# Patient Record
Sex: Female | Born: 1985 | Race: Black or African American | Hispanic: No | Marital: Single | Smoking: Former smoker
Health system: Southern US, Community
[De-identification: ages and names within clinical notes are randomized; demographics above are authoritative.]

---

## 2007-02-28 ENCOUNTER — Emergency Department: Payer: Self-pay | Admitting: Emergency Medicine

## 2008-02-22 ENCOUNTER — Emergency Department: Payer: Self-pay | Admitting: Internal Medicine

## 2008-05-09 ENCOUNTER — Emergency Department: Payer: Self-pay | Admitting: Emergency Medicine

## 2008-11-25 ENCOUNTER — Emergency Department: Payer: Self-pay | Admitting: Emergency Medicine

## 2011-09-24 ENCOUNTER — Observation Stay: Payer: Self-pay

## 2011-09-24 LAB — URINALYSIS, COMPLETE
Bacteria: NONE SEEN
Bilirubin,UR: NEGATIVE
Blood: NEGATIVE
Ketone: NEGATIVE
Ph: 8 (ref 4.5–8.0)
Protein: NEGATIVE
RBC,UR: 1 /HPF (ref 0–5)
Specific Gravity: 1.017 (ref 1.003–1.030)
Squamous Epithelial: 8

## 2011-09-26 LAB — URINE CULTURE

## 2012-01-26 ENCOUNTER — Observation Stay: Payer: Self-pay | Admitting: Obstetrics and Gynecology

## 2012-01-31 ENCOUNTER — Inpatient Hospital Stay: Payer: Self-pay | Admitting: Obstetrics and Gynecology

## 2012-01-31 LAB — PIH PROFILE
Chloride: 107 mmol/L (ref 98–107)
Co2: 25 mmol/L (ref 21–32)
Creatinine: 0.97 mg/dL (ref 0.60–1.30)
EGFR (African American): >60
EGFR (Non-African Amer.): >60
HCT: 33.2 % — ABNORMAL LOW (ref 35.0–47.0)
HGB: 10.7 g/dL — ABNORMAL LOW (ref 12.0–16.0)
MCH: 26.4 pg (ref 26.0–34.0)
MCHC: 32.2 g/dL (ref 32.0–36.0)
MCV: 82 fL (ref 80–100)
Platelet: 162 10*3/uL (ref 150–440)
Potassium: 3.6 mmol/L (ref 3.5–5.1)
RBC: 4.06 10*6/uL (ref 3.80–5.20)
Uric Acid: 5.8 mg/dL (ref 2.6–6.0)

## 2012-01-31 LAB — PROTEIN / CREATININE RATIO, URINE: Creatinine, Urine: 36.2 mg/dL (ref 30.0–125.0)

## 2012-02-02 LAB — HEMATOCRIT: HCT: 28 % — ABNORMAL LOW (ref 35.0–47.0)

## 2012-12-01 ENCOUNTER — Emergency Department: Payer: Self-pay | Admitting: Emergency Medicine

## 2013-04-15 ENCOUNTER — Emergency Department: Payer: Self-pay | Admitting: Emergency Medicine

## 2013-04-15 LAB — COMPREHENSIVE METABOLIC PANEL
Albumin: 3.6 g/dL (ref 3.4–5.0)
Alkaline Phosphatase: 70 U/L (ref 50–136)
Anion Gap: 6 — ABNORMAL LOW (ref 7–16)
BUN: 11 mg/dL (ref 7–18)
Bilirubin,Total: 0.5 mg/dL (ref 0.2–1.0)
Chloride: 106 mmol/L (ref 98–107)
EGFR (African American): >60
EGFR (Non-African Amer.): >60
Osmolality: 277 (ref 275–301)
Potassium: 3.4 mmol/L — ABNORMAL LOW (ref 3.5–5.1)
SGOT(AST): 23 U/L (ref 15–37)
SGPT (ALT): 21 U/L (ref 12–78)
Sodium: 139 mmol/L (ref 136–145)
Total Protein: 7.4 g/dL (ref 6.4–8.2)

## 2013-04-15 LAB — URINALYSIS, COMPLETE
Bacteria: NONE SEEN
Bilirubin,UR: NEGATIVE
Protein: NEGATIVE
RBC,UR: 32 /HPF (ref 0–5)
Specific Gravity: 1.017 (ref 1.003–1.030)
Squamous Epithelial: 2
WBC UR: 11 /HPF (ref 0–5)

## 2013-04-15 LAB — CBC
HGB: 11.7 g/dL — ABNORMAL LOW (ref 12.0–16.0)
MCH: 27 pg (ref 26.0–34.0)
MCV: 81 fL (ref 80–100)
WBC: 7.8 10*3/uL (ref 3.6–11.0)

## 2013-04-15 LAB — WET PREP, GENITAL

## 2013-04-15 LAB — GC/CHLAMYDIA PROBE AMP

## 2013-07-31 ENCOUNTER — Emergency Department: Payer: Self-pay | Admitting: Emergency Medicine

## 2013-07-31 LAB — COMPREHENSIVE METABOLIC PANEL
ANION GAP: 6 — AB (ref 7–16)
Albumin: 4 g/dL (ref 3.4–5.0)
Alkaline Phosphatase: 69 U/L
BUN: 10 mg/dL (ref 7–18)
Bilirubin,Total: 0.6 mg/dL (ref 0.2–1.0)
CALCIUM: 9.5 mg/dL (ref 8.5–10.1)
CO2: 26 mmol/L (ref 21–32)
CREATININE: 0.88 mg/dL (ref 0.60–1.30)
Chloride: 101 mmol/L (ref 98–107)
EGFR (African American): >60
EGFR (Non-African Amer.): >60
Glucose: 75 mg/dL (ref 65–99)
Osmolality: 264 (ref 275–301)
Potassium: 3.5 mmol/L (ref 3.5–5.1)
SGOT(AST): 30 U/L (ref 15–37)
SGPT (ALT): 24 U/L (ref 12–78)
SODIUM: 133 mmol/L — AB (ref 136–145)
Total Protein: 8.6 g/dL — ABNORMAL HIGH (ref 6.4–8.2)

## 2013-07-31 LAB — CBC WITH DIFFERENTIAL/PLATELET
BASOS ABS: 0.1 10*3/uL (ref 0.0–0.1)
Basophil %: 0.8 %
EOS ABS: 0.1 10*3/uL (ref 0.0–0.7)
EOS PCT: 0.8 %
HCT: 41.6 % (ref 35.0–47.0)
HGB: 13.7 g/dL (ref 12.0–16.0)
Lymphocyte #: 2.8 10*3/uL (ref 1.0–3.6)
Lymphocyte %: 29.4 %
MCH: 25.9 pg — ABNORMAL LOW (ref 26.0–34.0)
MCHC: 32.8 g/dL (ref 32.0–36.0)
MCV: 79 fL — ABNORMAL LOW (ref 80–100)
MONO ABS: 0.6 x10 3/mm (ref 0.2–0.9)
Monocyte %: 5.9 %
Neutrophil #: 6.1 10*3/uL (ref 1.4–6.5)
Neutrophil %: 63.1 %
Platelet: 255 10*3/uL (ref 150–440)
RBC: 5.27 10*6/uL — AB (ref 3.80–5.20)
RDW: 15.3 % — ABNORMAL HIGH (ref 11.5–14.5)
WBC: 9.6 10*3/uL (ref 3.6–11.0)

## 2013-07-31 LAB — URINALYSIS, COMPLETE
BILIRUBIN, UR: NEGATIVE
Bilirubin,UR: NEGATIVE
Blood: NEGATIVE
Blood: NEGATIVE
GLUCOSE, UR: NEGATIVE mg/dL (ref 0–75)
GLUCOSE, UR: NEGATIVE mg/dL (ref 0–75)
Leukocyte Esterase: NEGATIVE
NITRITE: NEGATIVE
Nitrite: NEGATIVE
PH: 6 (ref 4.5–8.0)
Ph: 6 (ref 4.5–8.0)
Protein: 30
RBC,UR: 1 /HPF (ref 0–5)
Specific Gravity: 1.03 (ref 1.003–1.030)
Specific Gravity: 1.03 (ref 1.003–1.030)
Squamous Epithelial: 19
Squamous Epithelial: 4
WBC UR: 1 /HPF (ref 0–5)

## 2013-07-31 LAB — GC/CHLAMYDIA PROBE AMP

## 2013-07-31 LAB — LIPASE, BLOOD: Lipase: 89 U/L (ref 73–393)

## 2013-07-31 LAB — WET PREP, GENITAL

## 2013-07-31 LAB — HCG, QUANTITATIVE, PREGNANCY: Beta Hcg, Quant.: 20830 m[IU]/mL — ABNORMAL HIGH

## 2013-08-02 ENCOUNTER — Emergency Department: Payer: Self-pay | Admitting: Internal Medicine

## 2013-08-02 LAB — CBC WITH DIFFERENTIAL/PLATELET
Basophil #: 0 10*3/uL (ref 0.0–0.1)
Basophil %: 0.5 %
EOS PCT: 0.6 %
Eosinophil #: 0 10*3/uL (ref 0.0–0.7)
HCT: 39.2 % (ref 35.0–47.0)
HGB: 13.1 g/dL (ref 12.0–16.0)
LYMPHS ABS: 1.3 10*3/uL (ref 1.0–3.6)
Lymphocyte %: 19.8 %
MCH: 25.9 pg — AB (ref 26.0–34.0)
MCHC: 33.5 g/dL (ref 32.0–36.0)
MCV: 77 fL — ABNORMAL LOW (ref 80–100)
Monocyte #: 0.4 x10 3/mm (ref 0.2–0.9)
Monocyte %: 6.1 %
Neutrophil #: 4.8 10*3/uL (ref 1.4–6.5)
Neutrophil %: 73 %
Platelet: 225 10*3/uL (ref 150–440)
RBC: 5.08 10*6/uL (ref 3.80–5.20)
RDW: 15.2 % — ABNORMAL HIGH (ref 11.5–14.5)
WBC: 6.5 10*3/uL (ref 3.6–11.0)

## 2013-08-02 LAB — URINALYSIS, COMPLETE
BLOOD: NEGATIVE
Bacteria: NONE SEEN
Bilirubin,UR: NEGATIVE
GLUCOSE, UR: NEGATIVE mg/dL (ref 0–75)
Leukocyte Esterase: NEGATIVE
Nitrite: NEGATIVE
PH: 5 (ref 4.5–8.0)
Protein: 100
RBC,UR: 1 /HPF (ref 0–5)
Specific Gravity: 1.034 (ref 1.003–1.030)
WBC UR: 7 /HPF (ref 0–5)

## 2013-08-02 LAB — COMPREHENSIVE METABOLIC PANEL
AST: 23 U/L (ref 15–37)
Albumin: 3.9 g/dL (ref 3.4–5.0)
Alkaline Phosphatase: 63 U/L
Anion Gap: 8 (ref 7–16)
BILIRUBIN TOTAL: 0.7 mg/dL (ref 0.2–1.0)
BUN: 9 mg/dL (ref 7–18)
CALCIUM: 9.4 mg/dL (ref 8.5–10.1)
CREATININE: 0.85 mg/dL (ref 0.60–1.30)
Chloride: 103 mmol/L (ref 98–107)
Co2: 22 mmol/L (ref 21–32)
EGFR (African American): >60
EGFR (Non-African Amer.): >60
Glucose: 72 mg/dL (ref 65–99)
OSMOLALITY: 264 (ref 275–301)
POTASSIUM: 3.3 mmol/L — AB (ref 3.5–5.1)
SGPT (ALT): 22 U/L (ref 12–78)
SODIUM: 133 mmol/L — AB (ref 136–145)
Total Protein: 8 g/dL (ref 6.4–8.2)

## 2013-08-02 LAB — URINE CULTURE

## 2013-08-06 ENCOUNTER — Emergency Department: Payer: Self-pay | Admitting: Emergency Medicine

## 2013-08-06 LAB — COMPREHENSIVE METABOLIC PANEL
ALK PHOS: 58 U/L
ANION GAP: 7 (ref 7–16)
Albumin: 3.7 g/dL (ref 3.4–5.0)
BUN: 8 mg/dL (ref 7–18)
Bilirubin,Total: 0.7 mg/dL (ref 0.2–1.0)
Calcium, Total: 8.8 mg/dL (ref 8.5–10.1)
Chloride: 101 mmol/L (ref 98–107)
Co2: 24 mmol/L (ref 21–32)
Creatinine: 0.98 mg/dL (ref 0.60–1.30)
Glucose: 72 mg/dL (ref 65–99)
Osmolality: 261 (ref 275–301)
POTASSIUM: 3.3 mmol/L — AB (ref 3.5–5.1)
SGOT(AST): 29 U/L (ref 15–37)
SGPT (ALT): 22 U/L (ref 12–78)
Sodium: 132 mmol/L — ABNORMAL LOW (ref 136–145)
TOTAL PROTEIN: 7.8 g/dL (ref 6.4–8.2)

## 2013-08-06 LAB — URINALYSIS, COMPLETE
BLOOD: NEGATIVE
Bacteria: NONE SEEN
Bilirubin,UR: NEGATIVE
GLUCOSE, UR: NEGATIVE mg/dL (ref 0–75)
Leukocyte Esterase: NEGATIVE
Nitrite: NEGATIVE
Ph: 5 (ref 4.5–8.0)
Protein: 30
RBC,UR: 1 /HPF (ref 0–5)
Specific Gravity: 1.032 (ref 1.003–1.030)

## 2013-08-06 LAB — CBC WITH DIFFERENTIAL/PLATELET
BASOS ABS: 0 10*3/uL (ref 0.0–0.1)
BASOS PCT: 0.4 %
Eosinophil #: 0.1 10*3/uL (ref 0.0–0.7)
Eosinophil %: 0.9 %
HCT: 40.7 % (ref 35.0–47.0)
HGB: 13.5 g/dL (ref 12.0–16.0)
Lymphocyte #: 1.6 10*3/uL (ref 1.0–3.6)
Lymphocyte %: 23.1 %
MCH: 25.8 pg — AB (ref 26.0–34.0)
MCHC: 33.1 g/dL (ref 32.0–36.0)
MCV: 78 fL — ABNORMAL LOW (ref 80–100)
Monocyte #: 0.4 x10 3/mm (ref 0.2–0.9)
Monocyte %: 5 %
Neutrophil #: 5 10*3/uL (ref 1.4–6.5)
Neutrophil %: 70.6 %
PLATELETS: 212 10*3/uL (ref 150–440)
RBC: 5.22 10*6/uL — AB (ref 3.80–5.20)
RDW: 15.1 % — ABNORMAL HIGH (ref 11.5–14.5)
WBC: 7.1 10*3/uL (ref 3.6–11.0)

## 2013-08-06 LAB — LIPASE, BLOOD: LIPASE: 57 U/L — AB (ref 73–393)

## 2013-08-06 LAB — HCG, QUANTITATIVE, PREGNANCY: BETA HCG, QUANT.: 50317 m[IU]/mL — AB

## 2013-08-06 LAB — TROPONIN I: Troponin-I: <0.02 ng/mL

## 2013-08-12 ENCOUNTER — Emergency Department: Payer: Self-pay | Admitting: Internal Medicine

## 2013-08-12 LAB — CBC WITH DIFFERENTIAL/PLATELET
BASOS ABS: 0 10*3/uL (ref 0.0–0.1)
Basophil %: 0.2 %
EOS PCT: 1.8 %
Eosinophil #: 0.1 10*3/uL (ref 0.0–0.7)
HCT: 36.8 % (ref 35.0–47.0)
HGB: 11.9 g/dL — ABNORMAL LOW (ref 12.0–16.0)
LYMPHS PCT: 20.8 %
Lymphocyte #: 1.1 10*3/uL (ref 1.0–3.6)
MCH: 25.7 pg — ABNORMAL LOW (ref 26.0–34.0)
MCHC: 32.3 g/dL (ref 32.0–36.0)
MCV: 80 fL (ref 80–100)
MONO ABS: 0.3 x10 3/mm (ref 0.2–0.9)
Monocyte %: 5 %
NEUTROS PCT: 72.2 %
Neutrophil #: 3.9 10*3/uL (ref 1.4–6.5)
Platelet: 173 10*3/uL (ref 150–440)
RBC: 4.62 10*6/uL (ref 3.80–5.20)
RDW: 15.1 % — ABNORMAL HIGH (ref 11.5–14.5)
WBC: 5.4 10*3/uL (ref 3.6–11.0)

## 2013-08-12 LAB — URINALYSIS, COMPLETE
Bilirubin,UR: NEGATIVE
Glucose,UR: NEGATIVE mg/dL (ref 0–75)
Ketone: NEGATIVE
Leukocyte Esterase: NEGATIVE
Nitrite: NEGATIVE
PROTEIN: NEGATIVE
Ph: 5 (ref 4.5–8.0)
SPECIFIC GRAVITY: 1.029 (ref 1.003–1.030)
Squamous Epithelial: 1

## 2013-08-12 LAB — COMPREHENSIVE METABOLIC PANEL
ALK PHOS: 52 U/L
Albumin: 3.2 g/dL — ABNORMAL LOW (ref 3.4–5.0)
Anion Gap: 6 — ABNORMAL LOW (ref 7–16)
BILIRUBIN TOTAL: 0.5 mg/dL (ref 0.2–1.0)
BUN: 9 mg/dL (ref 7–18)
CALCIUM: 8.2 mg/dL — AB (ref 8.5–10.1)
Chloride: 106 mmol/L (ref 98–107)
Co2: 25 mmol/L (ref 21–32)
Creatinine: 0.92 mg/dL (ref 0.60–1.30)
Glucose: 79 mg/dL (ref 65–99)
OSMOLALITY: 271 (ref 275–301)
Potassium: 3.4 mmol/L — ABNORMAL LOW (ref 3.5–5.1)
SGOT(AST): 21 U/L (ref 15–37)
SGPT (ALT): 15 U/L (ref 12–78)
Sodium: 137 mmol/L (ref 136–145)
TOTAL PROTEIN: 6.7 g/dL (ref 6.4–8.2)

## 2013-08-12 LAB — HCG, QUANTITATIVE, PREGNANCY: BETA HCG, QUANT.: 6811 m[IU]/mL — AB

## 2013-11-30 ENCOUNTER — Emergency Department: Payer: Self-pay | Admitting: Emergency Medicine

## 2014-02-27 ENCOUNTER — Emergency Department: Payer: Self-pay | Admitting: Emergency Medicine

## 2014-05-27 ENCOUNTER — Emergency Department: Payer: Self-pay | Admitting: Emergency Medicine

## 2014-05-27 LAB — COMPREHENSIVE METABOLIC PANEL
ALBUMIN: 3.6 g/dL (ref 3.4–5.0)
AST: 24 U/L (ref 15–37)
Alkaline Phosphatase: 71 U/L
Anion Gap: 7 (ref 7–16)
BUN: 17 mg/dL (ref 7–18)
Bilirubin,Total: 0.5 mg/dL (ref 0.2–1.0)
CREATININE: 1.11 mg/dL (ref 0.60–1.30)
Calcium, Total: 8.2 mg/dL — ABNORMAL LOW (ref 8.5–10.1)
Chloride: 109 mmol/L — ABNORMAL HIGH (ref 98–107)
Co2: 28 mmol/L (ref 21–32)
EGFR (Non-African Amer.): >60
Glucose: 94 mg/dL (ref 65–99)
Osmolality: 288 (ref 275–301)
Potassium: 3.8 mmol/L (ref 3.5–5.1)
SGPT (ALT): 17 U/L
Sodium: 144 mmol/L (ref 136–145)
Total Protein: 6.9 g/dL (ref 6.4–8.2)

## 2014-05-27 LAB — CBC WITH DIFFERENTIAL/PLATELET
BASOS ABS: 0 10*3/uL (ref 0.0–0.1)
Basophil %: 0.4 %
Eosinophil #: 0.1 10*3/uL (ref 0.0–0.7)
Eosinophil %: 1.3 %
HCT: 36.9 % (ref 35.0–47.0)
HGB: 11.8 g/dL — ABNORMAL LOW (ref 12.0–16.0)
LYMPHS ABS: 1.4 10*3/uL (ref 1.0–3.6)
Lymphocyte %: 14 %
MCH: 26.3 pg (ref 26.0–34.0)
MCHC: 32.1 g/dL (ref 32.0–36.0)
MCV: 82 fL (ref 80–100)
Monocyte #: 0.6 x10 3/mm (ref 0.2–0.9)
Monocyte %: 6.2 %
Neutrophil #: 7.8 10*3/uL — ABNORMAL HIGH (ref 1.4–6.5)
Neutrophil %: 78.1 %
PLATELETS: 188 10*3/uL (ref 150–440)
RBC: 4.49 10*6/uL (ref 3.80–5.20)
RDW: 15.3 % — AB (ref 11.5–14.5)
WBC: 9.9 10*3/uL (ref 3.6–11.0)

## 2014-05-27 LAB — URINALYSIS, COMPLETE
BILIRUBIN, UR: NEGATIVE
Blood: NEGATIVE
Glucose,UR: NEGATIVE mg/dL (ref 0–75)
Ketone: NEGATIVE
Nitrite: NEGATIVE
Ph: 5 (ref 4.5–8.0)
Protein: NEGATIVE
Specific Gravity: 1.031 (ref 1.003–1.030)
Squamous Epithelial: 9
WBC UR: 19 /HPF (ref 0–5)

## 2014-05-27 LAB — LIPASE, BLOOD: Lipase: 437 U/L — ABNORMAL HIGH (ref 73–393)

## 2014-08-15 ENCOUNTER — Emergency Department: Payer: Self-pay | Admitting: Emergency Medicine

## 2014-11-27 NOTE — H&P (Signed)
L&D Evaluation:  History:   HPI 29 year old G1 P0 with EDC=01/25/2012 by a 22 wk 1 day ultrasound presents at 40wk1d EGA intermitten contractions. +FM, no LOF, no VB.  Patient is scheduled for IOL at [redacted] weeks gestation. [facility], [facility].    Presents with abdominal pain, rectal pain    Patient's Medical History No Chronic Illness    Patient's Surgical History D&C    Medications Pre [place]atal Vitamins  hemorrhoidal cream and witch hazel    Allergies NKDA    Social History tobacco    Family History Non-Contributory   ROS:   ROS All systems were reviewed.  HEENT, CNS, GI, GU, Respiratory, CV, Renal and Musculoskeletal systems were found to be normal.   Exam:   Vital Signs stable    Urine Protein not completed    General no apparent distress    Abdomen gravid, non-tender    Estimated Fetal Weight Average for gestational age    Edema no edema    Pelvic no external lesions, 1/70/-2    Mebranes Intact    FHT normal rate with no decels    Ucx irregular    Skin no lesions   Impression:   Impression Braxton Hicks contractions   Plan:   Comments Patient monitored over 2-hr period with no evidence of cervical change and contractions spacing out.  Category I tracing, reactive NST.  Keep previously scheduled induction date.  Given routine labor precautions   Electronic Signatures: Lorrene ReidStaebler, Exavior Kimmons M (MD)  (Signed 09-Jul-13 18:04)  Authored: L&D Evaluation   Last Updated: 09-Jul-13 18:04 by Lorrene ReidStaebler, Averi Kilty M (MD)

## 2014-11-27 NOTE — H&P (Signed)
L&D Evaluation:  History:   HPI 29 year old G1 P0 with EDC=01/25/2012 by a 22 wk 1 day ultrasound presents at [facility]22[facility]3d E[facility] with c/o pain in rectum that radiates to the RLQ, especially with movement. Has had this pain for the past 5-6 hours. Pain is better if she lies still. C/o pain in rectum from hemorrhoids and having to strain to have BMs. Has been applying hemmorhoid cream and witch hazel to the hemorrhoids. Also has had some difficulty with pain as she begins to urinate. Has urinary frequency x 2 weeks and nocturia. Denies vaginal bleeding, unusual vaginal discharge, rectal bleeding, or hematuria. No fever or chills, N/V. Has normal appetite. [facility], [facility].    Presents with abdominal pain, rectal pain    Patient's Medical History No Chronic Illness    Patient's Surgical History none    Medications Pre [place]atal Vitamins  hemorrhoidal cream and witch hazel    Allergies NKDA    Social History tobacco   ROS:   ROS see HPI   Exam:   Vital Signs stable    Urine Protein negative dipstick, UA: +1 leuks, 3WBC/HPF; neg nitrites and blood    General lying still on side when first seen by provider    Mental Status clear    Abdomen gravid, tenderness in RLQ and LLQ with palpation. upper abdomen ND, BS active, no RUQ pain    Fetal Position cephalic on US    Edema no edema    Pelvic no external lesions, cervix closed and thick, yellow creamy discharge. wet prep positive for increased WBC, neg for Trich, clue cells, and hyphae.    Mebranes Intact    FHT 145    Ucx absent, ?palpated mild tightening when she needed to void    Skin dry   Impression:   Impression IUP at 22+ weeks with rectal pain possibly from straining to have BM. R/O UTI. Probable round ligament pain.   Plan:   Plan urine C&S sent. Aptima done. Start macrobid -one BID while awaiting urine culture resultsd Rec Miralax daily until normal BM and then Colace daily  with PNV. Heat to round ligament. RTO as scheduled and prn.   Electronic Signatures: Trinna BalloonGutierrez, Miyo Aina L (CNM)  (Signed 07-Mar-13 10:38)  Authored: L&D Evaluation   Last Updated: 07-Mar-13 10:38 by Trinna BalloonGutierrez, Milas Schappell L (CNM)

## 2015-05-04 ENCOUNTER — Emergency Department
Admission: EM | Admit: 2015-05-04 | Discharge: 2015-05-04 | Disposition: A | Discharge status: Home / Self Care | Payer: Self-pay | Attending: Emergency Medicine | Admitting: Emergency Medicine

## 2015-05-04 DIAGNOSIS — H6501 Acute serous otitis media, right ear: Secondary | ICD-10-CM | POA: Insufficient documentation

## 2015-05-04 DIAGNOSIS — J069 Acute upper respiratory infection, unspecified: Secondary | ICD-10-CM | POA: Insufficient documentation

## 2015-05-04 DIAGNOSIS — H6992 Unspecified Eustachian tube disorder, left ear: Secondary | ICD-10-CM | POA: Insufficient documentation

## 2015-05-04 DIAGNOSIS — H6982 Other specified disorders of Eustachian tube, left ear: Secondary | ICD-10-CM

## 2015-05-04 MED ORDER — CETIRIZINE HCL 10 MG PO TABS: 10.0000 mg | ORAL_TABLET | Freq: Every day | ORAL | Status: DC

## 2015-05-04 MED ORDER — FLUTICASONE PROPIONATE 50 MCG/ACT NA SUSP: 1.0000 | Freq: Two times a day (BID) | NASAL | Status: DC

## 2015-05-04 MED ORDER — AMOXICILLIN 875 MG PO TABS: 875.0000 mg | ORAL_TABLET | Freq: Two times a day (BID) | ORAL | Status: DC

## 2015-05-04 NOTE — ED Notes (Signed)
Pt c/o right ear pain x 1 week. Reports pain began in left ear today.

## 2015-05-04 NOTE — ED Notes (Signed)
Pt reports right ear pain x 1 week and left x 1 day.  Pt reports hearing is muffled and feeling pain/pressure from ears to eyes and neck.  Pt denies any drainage.  Pt reports having sore throat as well.  Pt NAD at this time.

## 2015-05-04 NOTE — Discharge Instructions (Signed)
Otitis Media, Adult Otitis media is redness, soreness, and inflammation of the middle ear. Otitis media may be caused by allergies or, most commonly, by infection. Often it occurs as a complication of the common cold. SIGNS AND SYMPTOMS Symptoms of otitis media may include:  Earache.  Fever.  Ringing in your ear.  Headache.  Leakage of fluid from the ear. DIAGNOSIS To diagnose otitis media, your health care provider will examine your ear with an otoscope. This is an instrument that allows your health care provider to see into your ear in order to examine your eardrum. Your health care provider also will ask you questions about your symptoms. TREATMENT  Typically, otitis media resolves on its own within 3-5 days. Your health care provider may prescribe medicine to ease your symptoms of pain. If otitis media does not resolve within 5 days or is recurrent, your health care provider may prescribe antibiotic medicines if he or she suspects that a bacterial infection is the cause. HOME CARE INSTRUCTIONS   If you were prescribed an antibiotic medicine, finish it all even if you start to feel better.  Take medicines only as directed by your health care provider.  Keep all follow-up visits as directed by your health care provider. SEEK MEDICAL CARE IF:  You have otitis media only in one ear, or bleeding from your nose, or both.  You notice a lump on your neck.  You are not getting better in 3-5 days.  You feel worse instead of better. SEEK IMMEDIATE MEDICAL CARE IF:   You have pain that is not controlled with medicine.  You have swelling, redness, or pain around your ear or stiffness in your neck.  You notice that part of your face is paralyzed.  You notice that the bone behind your ear (mastoid) is tender when you touch it. MAKE SURE YOU:   Understand these instructions.  Will watch your condition.  Will get help right away if you are not doing well or get worse.   This  information is not intended to replace advice given to you by your health care provider. Make sure you discuss any questions you have with your health care provider.   Document Released: 04/10/2004 Document Revised: 07/27/2014 Document Reviewed: 01/31/2013 Elsevier Interactive Patient Education 2016 [employer] Inc[employer]  Viral Infections A viral infection can be caused by different types of viruses.Most viral infections are not serious and resolve on their own. However, some infections may cause severe symptoms and may lead to further complications. SYMPTOMS Viruses can frequently cause:  Minor sore throat.  Aches and pains.  Headaches.  Runny nose.  Different types of rashes.  Watery eyes.  Tiredness.  Cough.  Loss of appetite.  Gastrointestinal infections, resulting in nausea, vomiting, and diarrhea. These symptoms do not respond to antibiotics because the infection is not caused by bacteria. However, you might catch a bacterial infection following the viral infection. This is sometimes called a "superinfection." Symptoms of such a bacterial infection may include:  Worsening sore throat with pus and difficulty swallowing.  Swollen neck glands.  Chills and a high or persistent fever.  Severe headache.  Tenderness over the sinuses.  Persistent overall ill feeling (malaise), muscle aches, and tiredness (fatigue).  Persistent cough.  Yellow, green, or brown mucus production with coughing. HOME CARE INSTRUCTIONS   Only take over-the-counter or prescription medicines for pain, discomfort, diarrhea, or fever as directed by your caregiver.  Drink enough water and fluids to keep your urine clear or pale  yellow. Sports drinks can provide valuable electrolytes, sugars, and hydration.  Get plenty of rest and maintain proper nutrition. Soups and broths with crackers or rice are fine. SEEK IMMEDIATE MEDICAL CARE IF:   You have severe headaches, shortness of breath, chest pain,  neck pain, or an unusual rash.  You have uncontrolled vomiting, diarrhea, or you are unable to keep down fluids.  You or your child has an oral temperature above 102 F (38.9 C), not controlled by medicine.  Your baby is older than 3 months with a rectal temperature of 102 F (38.9 C) or higher.  Your baby is 17 months old or younger with a rectal temperature of 100.4 F (38 C) or higher. MAKE SURE YOU:   Understand these instructions.  Will watch your condition.  Will get help right away if you are not doing well or get worse.   This information is not intended to replace advice given to you by your health care provider. Make sure you discuss any questions you have with your health care provider.   Document Released: 04/15/2005 Document Revised: 09/28/2011 Document Reviewed: 12/12/2014 Elsevier Interactive Patient Education [employer]

## 2015-05-04 NOTE — ED Provider Notes (Signed)
[facility] Emergency Department Provider Note  ____________________________________________  Time seen: Approximately 9:29 PM  I have reviewed the triage vital signs and the nursing notes.   HISTORY  Chief Complaint Otalgia    HPI SASHAY FELLING is a 29 y.o. female who presents to the emergency department with bilateral ear pain, nasal congestion and cough. She states that symptoms began with right ear hurting approximately one week ago. She states that her left now hurting and it is been hurting for one day. She states that in the intervening period of a week she has developed nasal congestion as well as a cough. She endorses mild low-grade tactile fevers. She has tried unknown type of ear drop for symptoms and states that this is not providing any relief. Patient has not taken any other medication for same. Symptoms are constant and worsening. The ear pain is described as a pressure and throbbing sensation. Cough is occasionally productive.   No past medical history on file.  There are no active problems to display for this patient.   No past surgical history on file.  Current Outpatient Rx  Name  Route  Sig  Dispense  Refill  . amoxicillin (AMOXIL) 875 MG tablet   Oral   Take 1 tablet (875 mg total) by mouth 2 (two) times daily.   14 tablet   0   . cetirizine (ZYRTEC) 10 MG tablet   Oral   Take 1 tablet (10 mg total) by mouth daily.   30 tablet   0   . fluticasone (FLONASE) 50 MCG/ACT nasal spray   Each Nare   Place 1 spray into both nostrils 2 (two) times daily.   16 g   0     Allergies Review of patient's allergies indicates no known allergies.  No family history on file.  Social History Social History  Substance Use Topics  . Smoking status: Not on file  . Smokeless tobacco: Not on file  . Alcohol Use: Not on file    Review of Systems Constitutional: No fever/chills Eyes: No visual changes. ENT: Endorses mild sore  throat. Endorses eye lateral ear pain. Endorses nasal congestion. Cardiovascular: Denies chest pain. Respiratory: Denies shortness of breath. Endorses occasional productive cough. Gastrointestinal: No abdominal pain.  No nausea, no vomiting.  No diarrhea.  No constipation. Genitourinary: Negative for dysuria. Musculoskeletal: Negative for back pain. Skin: Negative for rash. Neurological: Negative for headaches, focal weakness or numbness.  10-point ROS otherwise negative.  ____________________________________________   PHYSICAL EXAM:  VITAL SIGNS: ED Triage Vitals  Enc Vitals Group     BP 05/04/15 2033 141/99 mmHg     Pulse Rate 05/04/15 2033 87     Resp 05/04/15 2033 18     Temp 05/04/15 2033 99.7 F (37.6 C)     Temp Source 05/04/15 2033 Oral     SpO2 05/04/15 2033 98 %     Weight 05/04/15 2033 170 lb (77.111 kg)     Height 05/04/15 2033  (1.575 m)     Head Cir --      Peak Flow --      Pain Score 05/04/15 2033 8     Pain Loc --      Pain Edu? --      Excl. in GC? --     Constitutional: Alert and oriented. Well appearing and in no acute distress. Eyes: Conjunctivae are normal. PERRL. EOMI. Head: Atraumatic. TMs visualized bilaterally. Right side is dusky in appearance,  bulging, with air fluid level. Left side is bulging the TM is of normal appearance and no air-fluid level noted. Nose: Moderate clear rhinorrhea. Mouth/Throat: Mucous membranes are moist.  Oropharynx non-erythematous. Clear nasal drip identified. Neck: No stridor.   Hematological/Lymphatic/Immunilogical: Diffuse, mobile, nontender anterior cervical lymphadenopathy. Cardiovascular: Normal rate, regular rhythm. Grossly normal heart sounds.  Good peripheral circulation. Respiratory: Normal respiratory effort.  No retractions. Lungs CTAB. Gastrointestinal: Soft and nontender. No distention. No abdominal bruits. No CVA tenderness. Musculoskeletal: No lower extremity tenderness nor edema.  No joint  effusions. Neurologic:  Normal speech and language. No gross focal neurologic deficits are appreciated. No gait instability. Skin:  Skin is warm, dry and intact. No rash noted. Psychiatric: Mood and affect are normal. Speech and behavior are normal.  ____________________________________________   LABS (all labs ordered are listed, but only abnormal results are displayed)  Labs Reviewed - No data to display ____________________________________________  EKG   ____________________________________________  RADIOLOGY   ____________________________________________   PROCEDURES  Procedure(s) performed: None  Critical Care performed: No  ____________________________________________   INITIAL IMPRESSION / ASSESSMENT AND PLAN / ED COURSE  Pertinent labs & imaging results that were available during my care of the patient were reviewed by me and considered in my medical decision making (see chart for details).  Agents history, symptoms, and physical exam are consistent with otitis media on the right side, eustachian tube dysfunction the left side, and viral upper respiratory illness. I advised the patient of findings and diagnosis. The patient verbalizes understanding of same. I am prescribing amoxicillin for otitis media and Zyrtec and Flonase for symptomatic relief of nasal congestion and eustachian tube dysfunction. The patient verbalizes understanding and compliance with treatment plan. I advised the patient to follow-up with the emergency department should symptoms worsen or primary care should symptoms persist past treatment course. Patient verbalizes understanding of same. ____________________________________________   FINAL CLINICAL IMPRESSION(S) / ED DIAGNOSES  Final diagnoses:  Right acute serous otitis media, recurrence not specified  Eustachian tube dysfunction, left  Viral upper respiratory illness      Racheal PatchesJonathan D Marvens Hollars, PA-C 05/04/15 2143  Darien Ramusavid W  Kaminski, MD 05/04/15 [phone]

## 2015-09-17 ENCOUNTER — Emergency Department: Payer: Self-pay

## 2015-09-17 ENCOUNTER — Observation Stay
Admission: EM | Admit: 2015-09-17 | Discharge: 2015-09-18 | Disposition: A | Discharge status: Home / Self Care | Payer: Self-pay | Attending: Internal Medicine | Admitting: Internal Medicine

## 2015-09-17 ENCOUNTER — Encounter: Payer: Self-pay | Admitting: Oncology

## 2015-09-17 ENCOUNTER — Observation Stay: Payer: Self-pay

## 2015-09-17 ENCOUNTER — Other Ambulatory Visit: Payer: Self-pay | Admitting: Oncology

## 2015-09-17 DIAGNOSIS — N390 Urinary tract infection, site not specified: Secondary | ICD-10-CM | POA: Insufficient documentation

## 2015-09-17 DIAGNOSIS — R1010 Upper abdominal pain, unspecified: Secondary | ICD-10-CM

## 2015-09-17 DIAGNOSIS — R109 Unspecified abdominal pain: Secondary | ICD-10-CM | POA: Diagnosis present

## 2015-09-17 DIAGNOSIS — R1084 Generalized abdominal pain: Secondary | ICD-10-CM

## 2015-09-17 DIAGNOSIS — Z809 Family history of malignant neoplasm, unspecified: Secondary | ICD-10-CM | POA: Insufficient documentation

## 2015-09-17 DIAGNOSIS — R112 Nausea with vomiting, unspecified: Secondary | ICD-10-CM | POA: Insufficient documentation

## 2015-09-17 DIAGNOSIS — F17200 Nicotine dependence, unspecified, uncomplicated: Secondary | ICD-10-CM | POA: Insufficient documentation

## 2015-09-17 DIAGNOSIS — E876 Hypokalemia: Secondary | ICD-10-CM | POA: Insufficient documentation

## 2015-09-17 DIAGNOSIS — E669 Obesity, unspecified: Secondary | ICD-10-CM | POA: Insufficient documentation

## 2015-09-17 DIAGNOSIS — R1110 Vomiting, unspecified: Secondary | ICD-10-CM

## 2015-09-17 DIAGNOSIS — Z8489 Family history of other specified conditions: Secondary | ICD-10-CM | POA: Insufficient documentation

## 2015-09-17 DIAGNOSIS — R599 Enlarged lymph nodes, unspecified: Secondary | ICD-10-CM | POA: Insufficient documentation

## 2015-09-17 DIAGNOSIS — R1011 Right upper quadrant pain: Principal | ICD-10-CM | POA: Insufficient documentation

## 2015-09-17 DIAGNOSIS — Z6832 Body mass index (BMI) 32.0-32.9, adult: Secondary | ICD-10-CM | POA: Insufficient documentation

## 2015-09-17 DIAGNOSIS — D72829 Elevated white blood cell count, unspecified: Secondary | ICD-10-CM | POA: Insufficient documentation

## 2015-09-17 LAB — CBC
HEMATOCRIT: 39.5 % (ref 35.0–47.0)
HEMOGLOBIN: 13.2 g/dL (ref 12.0–16.0)
MCH: 26 pg (ref 26.0–34.0)
MCHC: 33.5 g/dL (ref 32.0–36.0)
MCV: 77.6 fL — AB (ref 80.0–100.0)
Platelets: 228 10*3/uL (ref 150–440)
RBC: 5.09 MIL/uL (ref 3.80–5.20)
RDW: 15.2 % — ABNORMAL HIGH (ref 11.5–14.5)
WBC: 13.1 10*3/uL — ABNORMAL HIGH (ref 3.6–11.0)

## 2015-09-17 LAB — URINE DRUG SCREEN, QUALITATIVE (ARMC ONLY)
Amphetamines, Ur Screen: NOT DETECTED
BARBITURATES, UR SCREEN: NOT DETECTED
BENZODIAZEPINE, UR SCRN: POSITIVE — AB
Cannabinoid 50 Ng, Ur ~~LOC~~: POSITIVE — AB
Cocaine Metabolite,Ur ~~LOC~~: POSITIVE — AB
MDMA (Ecstasy)Ur Screen: NOT DETECTED
METHADONE SCREEN, URINE: NOT DETECTED
OPIATE, UR SCREEN: POSITIVE — AB
Phencyclidine (PCP) Ur S: NOT DETECTED
TRICYCLIC, UR SCREEN: NOT DETECTED

## 2015-09-17 LAB — URINALYSIS COMPLETE WITH MICROSCOPIC (ARMC ONLY)
Bilirubin Urine: NEGATIVE
Glucose, UA: NEGATIVE mg/dL
Nitrite: NEGATIVE
PROTEIN: 30 mg/dL — AB
Specific Gravity, Urine: 1.031 — ABNORMAL HIGH (ref 1.005–1.030)
pH: 5 (ref 5.0–8.0)

## 2015-09-17 LAB — PREGNANCY, URINE: Preg Test, Ur: NEGATIVE

## 2015-09-17 LAB — COMPREHENSIVE METABOLIC PANEL
ALK PHOS: 62 U/L (ref 38–126)
ALT: 14 U/L (ref 14–54)
AST: 21 U/L (ref 15–41)
Albumin: 4.5 g/dL (ref 3.5–5.0)
Anion gap: 9 (ref 5–15)
BUN: 14 mg/dL (ref 6–20)
CALCIUM: 9.1 mg/dL (ref 8.9–10.3)
CO2: 26 mmol/L (ref 22–32)
CREATININE: 1.14 mg/dL — AB (ref 0.44–1.00)
Chloride: 105 mmol/L (ref 101–111)
GFR calc non Af Amer: >60 mL/min (ref >60)
Glucose, Bld: 102 mg/dL — ABNORMAL HIGH (ref 65–99)
Potassium: 3.4 mmol/L — ABNORMAL LOW (ref 3.5–5.1)
SODIUM: 140 mmol/L (ref 135–145)
Total Bilirubin: 0.5 mg/dL (ref 0.3–1.2)
Total Protein: 7.9 g/dL (ref 6.5–8.1)

## 2015-09-17 LAB — ETHANOL

## 2015-09-17 LAB — LIPASE, BLOOD: Lipase: 18 U/L (ref 11–51)

## 2015-09-17 MED ORDER — IOHEXOL 240 MG/ML SOLN
25.0000 mL | Freq: Once | INTRAMUSCULAR | Status: AC | PRN
  Administered 2015-09-17: 25 mL via ORAL

## 2015-09-17 MED ORDER — HYDROMORPHONE HCL 1 MG/ML IJ SOLN
1.0000 mg | INTRAMUSCULAR | Status: DC | PRN
  Administered 2015-09-17: 1 mg via INTRAVENOUS
  Filled 2015-09-17 (×2): qty 1

## 2015-09-17 MED ORDER — TRAZODONE HCL 50 MG PO TABS
25.0000 mg | ORAL_TABLET | Freq: Every evening | ORAL | Status: DC | PRN
  Filled 2015-09-17: qty 0.5

## 2015-09-17 MED ORDER — ACETAMINOPHEN 650 MG RE SUPP: 650.0000 mg | Freq: Four times a day (QID) | RECTAL | Status: DC | PRN

## 2015-09-17 MED ORDER — HYDROMORPHONE HCL 1 MG/ML IJ SOLN
0.5000 mg | Freq: Once | INTRAMUSCULAR | Status: AC
  Administered 2015-09-17: 0.5 mg via INTRAVENOUS

## 2015-09-17 MED ORDER — METRONIDAZOLE IN NACL 5-0.79 MG/ML-% IV SOLN
500.0000 mg | Freq: Three times a day (TID) | INTRAVENOUS | Status: DC
  Administered 2015-09-17 – 2015-09-18 (×3): 500 mg via INTRAVENOUS
  Filled 2015-09-17 (×6): qty 100

## 2015-09-17 MED ORDER — ONDANSETRON HCL 4 MG/2ML IJ SOLN
4.0000 mg | Freq: Four times a day (QID) | INTRAMUSCULAR | Status: DC | PRN
  Administered 2015-09-17: 4 mg via INTRAVENOUS
  Filled 2015-09-17: qty 2

## 2015-09-17 MED ORDER — ONDANSETRON HCL 4 MG/2ML IJ SOLN
4.0000 mg | Freq: Once | INTRAMUSCULAR | Status: AC
  Administered 2015-09-17: 4 mg via INTRAVENOUS
  Filled 2015-09-17: qty 2

## 2015-09-17 MED ORDER — ONDANSETRON HCL 4 MG/2ML IJ SOLN
INTRAMUSCULAR | Status: AC
  Filled 2015-09-17: qty 2

## 2015-09-17 MED ORDER — ONDANSETRON HCL 4 MG/2ML IJ SOLN
4.0000 mg | Freq: Once | INTRAMUSCULAR | Status: AC
  Administered 2015-09-17: 4 mg via INTRAVENOUS

## 2015-09-17 MED ORDER — ONDANSETRON HCL 4 MG/2ML IJ SOLN
INTRAMUSCULAR | Status: AC
  Administered 2015-09-17: 4 mg via INTRAVENOUS
  Filled 2015-09-17: qty 2

## 2015-09-17 MED ORDER — HEPARIN SODIUM (PORCINE) 5000 UNIT/ML IJ SOLN
5000.0000 [IU] | Freq: Three times a day (TID) | INTRAMUSCULAR | Status: DC
Start: 2015-09-17 — End: 2015-09-18

## 2015-09-17 MED ORDER — BISACODYL 5 MG PO TBEC: 5.0000 mg | DELAYED_RELEASE_TABLET | Freq: Every day | ORAL | Status: DC | PRN

## 2015-09-17 MED ORDER — CEFTRIAXONE SODIUM 1 G IJ SOLR
1.0000 g | INTRAMUSCULAR | Status: DC
  Administered 2015-09-17: 1 g via INTRAVENOUS
  Filled 2015-09-17 (×2): qty 10

## 2015-09-17 MED ORDER — DIAZEPAM 5 MG/ML IJ SOLN
2.0000 mg | Freq: Once | INTRAMUSCULAR | Status: AC
  Administered 2015-09-17: 2 mg via INTRAVENOUS

## 2015-09-17 MED ORDER — SODIUM CHLORIDE 0.9 % IV BOLUS (SEPSIS)
1000.0000 mL | Freq: Once | INTRAVENOUS | Status: AC
  Administered 2015-09-17: 1000 mL via INTRAVENOUS

## 2015-09-17 MED ORDER — ACETAMINOPHEN 325 MG PO TABS: 650.0000 mg | ORAL_TABLET | Freq: Four times a day (QID) | ORAL | Status: DC | PRN

## 2015-09-17 MED ORDER — HYDROCODONE-ACETAMINOPHEN 5-325 MG PO TABS
1.0000 | ORAL_TABLET | ORAL | Status: DC | PRN
  Administered 2015-09-17 (×4): 2 via ORAL
  Filled 2015-09-17 (×4): qty 2

## 2015-09-17 MED ORDER — MORPHINE SULFATE (PF) 4 MG/ML IV SOLN
4.0000 mg | Freq: Once | INTRAVENOUS | Status: AC
  Administered 2015-09-17: 4 mg via INTRAVENOUS

## 2015-09-17 MED ORDER — SODIUM CHLORIDE 0.9 % IV SOLN
INTRAVENOUS | Status: DC
  Administered 2015-09-17 – 2015-09-18 (×4): via INTRAVENOUS

## 2015-09-17 MED ORDER — HYDROMORPHONE HCL 1 MG/ML IJ SOLN
1.0000 mg | INTRAMUSCULAR | Status: DC | PRN
  Administered 2015-09-17: 1 mg via INTRAVENOUS

## 2015-09-17 MED ORDER — PANTOPRAZOLE SODIUM 40 MG IV SOLR
40.0000 mg | Freq: Once | INTRAVENOUS | Status: AC
  Administered 2015-09-17: 40 mg via INTRAVENOUS
  Filled 2015-09-17: qty 40

## 2015-09-17 MED ORDER — PANTOPRAZOLE SODIUM 40 MG IV SOLR
40.0000 mg | Freq: Two times a day (BID) | INTRAVENOUS | Status: DC
  Administered 2015-09-17 – 2015-09-18 (×3): 40 mg via INTRAVENOUS
  Filled 2015-09-17 (×3): qty 40

## 2015-09-17 MED ORDER — MORPHINE SULFATE (PF) 4 MG/ML IV SOLN
INTRAVENOUS | Status: AC
  Administered 2015-09-17: 4 mg via INTRAVENOUS
  Filled 2015-09-17: qty 1

## 2015-09-17 MED ORDER — ONDANSETRON HCL 4 MG PO TABS: 4.0000 mg | ORAL_TABLET | Freq: Four times a day (QID) | ORAL | Status: DC | PRN

## 2015-09-17 MED ORDER — DOCUSATE SODIUM 100 MG PO CAPS
100.0000 mg | ORAL_CAPSULE | Freq: Two times a day (BID) | ORAL | Status: DC
  Administered 2015-09-17: 100 mg via ORAL
  Filled 2015-09-17 (×2): qty 1

## 2015-09-17 MED ORDER — IOHEXOL 300 MG/ML  SOLN
100.0000 mL | Freq: Once | INTRAMUSCULAR | Status: AC | PRN
  Administered 2015-09-17: 100 mL via INTRAVENOUS

## 2015-09-17 MED ORDER — HYDROMORPHONE HCL 1 MG/ML IJ SOLN
INTRAMUSCULAR | Status: AC
  Administered 2015-09-17: 0.5 mg via INTRAVENOUS
  Filled 2015-09-17: qty 1

## 2015-09-17 MED ORDER — ALUM & MAG HYDROXIDE-SIMETH 200-200-20 MG/5ML PO SUSP
30.0000 mL | Freq: Four times a day (QID) | ORAL | Status: DC | PRN
  Filled 2015-09-17: qty 30

## 2015-09-17 MED ORDER — DIAZEPAM 5 MG/ML IJ SOLN
2.0000 mg | Freq: Once | INTRAMUSCULAR | Status: AC
  Administered 2015-09-17: 2 mg via INTRAVENOUS
  Filled 2015-09-17: qty 2

## 2015-09-17 NOTE — Consult Note (Signed)
Consultation  Referring Provider:     Dr Sherryll Burger Admit date 09/17/15 Consult date 09/17/15        Reason for Consultation:    Abdominal pain          HPI:   Carolyn Nunez is a 30 y.o. female admitted earlier this morning after developing severe RUQ pain with NV after eating fried chicken/gizzards last night.  States her emesis was green in nature. Says she took an Ibuprofen and laid down for a while but this did not help the pain. States she has never felt like this before and the pain has been unbearable. States she has had no regular NSAID use, no melena/ hematochezia, problems swallowing, and the  only time GERD has been problematic is when she was pregnant 67yr ago. States the pain now spreads across the top of her abdomen. Has been given dilaudid and Norco with varying help.  Has PPI and Rocephin ordered (this is for UTI). No NV presently. Denies diarrhea and all other GI complaints. There is no history of luminal evaluation.  Labs with mild leukocytosis, normal hgb platelets/lipase/lfts. Etoh negative.   Did have unremarkable RUQ [place] in the ED and a contrasted CT of the abdomen/pelvis which was pretty unremarkable with the exception of a left adnexal cyst and an unusual somewhat low density large clump of mesenteric nodes noted at the right mid abdomen, measuring 5.9 x 3.6 x 5.3 cm.This could reflect a lymphoproliferative disorder or possibly an unusual appearance to mesenteric adenitis. States she did have a flu like illness over the weekend with body aches/low grade temp/chills. This improved Saturday.  Has been seen by Dr Orlie Dakin who is assessing for lymphoproliferative disorders. Dr Everlene Farrier is to come see for a surgical opinion.  Incidentally, was seen at [facility] ED last year this time for viral gastroenteritis.     History reviewed. No pertinent past medical history.  No past surgical history on file. C-section.  No family history on file. Negative for colorectal cancer, colon polyps, liver  disease, PUD.  Social History  Substance Use Topics  . Smoking status: None  . Smokeless tobacco: None  . Alcohol Use: None    Prior to Admission medications   Not on File  none  Current Facility-Administered Medications  Medication Dose Route Frequency Provider Last Rate Last Dose  . 0.9 %  sodium chloride infusion   Intravenous Continuous Delfino Lovett, MD 125 mL/hr at 09/17/15 0913    . acetaminophen (TYLENOL) tablet 650 mg  650 mg Oral Q6H PRN Delfino Lovett, MD       Or  . acetaminophen (TYLENOL) suppository 650 mg  650 mg Rectal Q6H PRN Delfino Lovett, MD      . alum & mag hydroxide-simeth (MAALOX/MYLANTA) 200-200-20 MG/5ML suspension 30 mL  30 mL Oral Q6H PRN Delfino Lovett, MD      . bisacodyl (DULCOLAX) EC tablet 5 mg  5 mg Oral Daily PRN Delfino Lovett, MD      . cefTRIAXone (ROCEPHIN) 1 g in dextrose 5 % 50 mL IVPB  1 g Intravenous Q24H Irean Hong, MD   Stopped at 09/17/15 0408  . docusate sodium (COLACE) capsule 100 mg  100 mg Oral BID Delfino Lovett, MD      . heparin injection 5,000 Units  5,000 Units Subcutaneous 3 times per day Delfino Lovett, MD      . HYDROcodone-acetaminophen (NORCO/VICODIN) 5-325 MG per tablet 1-2 tablet  1-2 tablet Oral Q4H PRN Delfino Lovett, MD  2 tablet at 09/17/15 1330  . HYDROmorphone (DILAUDID) injection 1 mg  1 mg Intravenous Q4H PRN Delfino Lovett, MD   1 mg at 09/17/15 1201  . ondansetron (ZOFRAN) tablet 4 mg  4 mg Oral Q6H PRN Delfino Lovett, MD       Or  . ondansetron (ZOFRAN) injection 4 mg  4 mg Intravenous Q6H PRN Delfino Lovett, MD      . traZODone (DESYREL) tablet 25 mg  25 mg Oral QHS PRN Delfino Lovett, MD        Allergies as of 09/17/2015  . (No Known Allergies)     Review of Systems:    All systems reviewed and negative except where noted in HPI.  Of note- patient does deny vaginal discharge, pain, STD history, GYN complaints, LMP last week.    Physical Exam:  Vital signs in last 24 hours: Temp:  [98.5 F (36.9 C)-99.8 F (37.7 C)] 98.7 F (37.1 C) (02/28  1157) Pulse Rate:  [84-107] 84 (02/28 1157) Resp:  [18] 18 (02/28 1157) BP: (91-130)/(61-99) 107/73 mmHg (02/28 1157) SpO2:  [93 %-100 %] 100 % (02/28 1157) Weight:  [77.111 kg (170 lb)] 77.111 kg (170 lb) (02/28 0203)   General:   Pleasant woman in NAD however appears uncomfortable. Head:  Normocephalic and atraumatic. Eyes:   No icterus.   Conjunctiva pink. Ears:  Normal auditory acuity. Mouth: Mucosa pink moist, no lesions. Neck:  Supple; no masses felt Lungs:  Respirations even and unlabored. Lungs clear to auscultation bilaterally.   No wheezes, crackles, or rhonchi.  Heart:  S1S2, RRR, no MRG. No edema. Abdomen:   Flat, soft, nondistended. There is generalized tenderness. Increases in the upper abdomen.  Normal bowel sounds. No appreciable masses or hepatomegaly. No rebound signs or other peritoneal signs. Msk:  MAEW x4, No clubbing or cyanosis. Strength 5/5. Symmetrical without gross deformities. Neurologic:  Alert and  oriented x4;  Cranial nerves II-XII intact.  Skin:  Warm, dry, pink without significant lesions or rashes. Psych:  Alert and cooperative. Normal affect.  LAB RESULTS:  Recent Labs  09/17/15 0231  WBC 13.1*  HGB 13.2  HCT 39.5  PLT 228   BMET  Recent Labs  09/17/15 0231  NA 140  K 3.4*  CL 105  CO2 26  GLUCOSE 102*  BUN 14  CREATININE 1.14*  CALCIUM 9.1   LFT  Recent Labs  09/17/15 0231  PROT 7.9  ALBUMIN 4.5  AST 21  ALT 14  ALKPHOS 62  BILITOT 0.5   PT/INR No results for input(s): LABPROT, INR in the last 72 hours.  STUDIES: Ct Abdomen Pelvis W Contrast  09/17/2015  CLINICAL DATA:  Acute onset of upper abdominal pain. Initial encounter. EXAM: CT ABDOMEN AND PELVIS WITH CONTRAST TECHNIQUE: Multidetector CT imaging of the abdomen and pelvis was performed using the standard protocol following bolus administration of intravenous contrast. CONTRAST:  OMNIPAQUE IOHEXOL 300 MG/ML  SOLN COMPARISON:  Right upper quadrant ultrasound  performed earlier today at 3:58 a.m. FINDINGS: The visualized lung bases are clear. The liver and spleen are unremarkable in appearance. The gallbladder is within normal limits. The pancreas and adrenal glands are unremarkable. The kidneys are unremarkable in appearance. There is no evidence of hydronephrosis. No renal or ureteral stones are seen. No perinephric stranding is appreciated. No free fluid is identified. The small bowel is unremarkable in appearance. The stomach is within normal limits. No acute vascular abnormalities are seen. Minimal calcification is noted along the  distal abdominal aorta. There is an unusual somewhat low density large clump of mesenteric nodes noted at the right mid abdomen, measuring 5.9 x 3.6 x 5.3 cm. This could reflect a lymphoproliferative disorder or possibly an unusual appearance to mesenteric adenitis. The appendix is normal in caliber, without evidence of appendicitis. The colon is unremarkable in appearance. The bladder is mildly distended and grossly remarkable. The uterus is unremarkable in appearance. A 4.4 cm left adnexal cystic focus is noted. The ovaries are otherwise unremarkable. No inguinal lymphadenopathy is seen. No acute osseous abnormalities are identified. IMPRESSION: 1. Unusual somewhat low-density large clump of mesenteric nodes at the right mid abdomen, measuring 5.9 x 3.6 x 5.3 cm. This could reflect a lymphoproliferative disorder, or given the patient's acute symptoms, might reflect an unusual appearance to mesenteric adenitis. Would correlate with the patient's labs, and consider PET/CT as deemed clinically appropriate, to help exclude malignancy. 2. 4.4 cm left adnexal cystic focus is likely physiologic, though pelvic ultrasound could be considered for further evaluation on an elective nonemergent basis. 3. Minimal calcification along the distal abdominal aorta. This is somewhat advanced for age. Electronically Signed   By: Roanna Raider M.D.   On:  09/17/2015 05:39   US Abdomen Limited Ruq  09/17/2015  CLINICAL DATA:  Acute onset of right upper quadrant abdominal pain. Initial encounter. EXAM: US ABDOMEN LIMITED - RIGHT UPPER QUADRANT COMPARISON:  None. FINDINGS: Gallbladder: No gallstones or wall thickening visualized. No sonographic Murphy sign noted by sonographer. Common bile duct: Diameter: 0.4 cm, within normal limits in caliber. Liver: No focal lesion identified. Within normal limits in parenchymal echogenicity. IMPRESSION: Unremarkable ultrasound of the right upper quadrant. Electronically Signed   By: Roanna Raider M.D.   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  Agree with surgical consult. Noted oncologist opinion.   Mesenteric adenitis can be bacterial/viral/reactive. Will discuss further with Dr Marva Panda as far as role for endoscopic evaluation  Thank you very much for this consult. These services were provided by Vevelyn Pat, NP-C, in collaboration with Christena Deem, MD, with whom I have discussed this patient in full.   Vevelyn Pat, NP-C  Addendum: did discuss with Dr Dierdre Searles. For now will add Metronidazole  IV tid for anaerobic coverage regarding the mesenteric adenitis. This may need to be biopsied.  May need GYN opinion on her adnexal cyst.

## 2015-09-17 NOTE — ED Notes (Signed)
Patient transported to CT 

## 2015-09-17 NOTE — ED Notes (Signed)
Pt in with co upper abd pain tonight, started after eating.

## 2015-09-17 NOTE — Consult Note (Signed)
[facility]  [phone] [phone]) [phone]  ID: Carolyn Nunez OB: [birth year]  MR#: [id number]  AOZ#:[id number]  Patient Care Team: No Pcp Per Patient as PCP - General (General Practice)  CHIEF COMPLAINT:  Chief Complaint  Patient presents with  . Abdominal Pain    INTERVAL HISTORY: Patient is a 30 year old female with no significant past medical history who presented to the emergency room with acute onset abdominal pain after eating a meal. Patient was in her usual state of health prior to her symptoms. She denies any fevers or recent illnesses. She has a good appetite and denies weight loss. She has no night sweats. She has no chest pain or shortness of breath. She denies any associated nausea, vomiting, constipation, or diarrhea. She has no urinary complaints. Patient feels generally terrible from her pain, but otherwise offers no further specific complaints.  REVIEW OF SYSTEMS:   Review of Systems  Constitutional: Negative.  Negative for fever, chills, weight loss, malaise/fatigue and diaphoresis.  Respiratory: Negative.  Negative for shortness of breath.   Cardiovascular: Negative.  Negative for chest pain.  Gastrointestinal: Positive for abdominal pain. Negative for nausea, vomiting, diarrhea, constipation, blood in stool and melena.  Genitourinary: Negative.   Musculoskeletal: Negative.   Neurological: Negative.  Negative for weakness.    As per HPI. Otherwise, a complete review of systems is negatve.  PAST MEDICAL HISTORY: History reviewed. No pertinent past medical history.  PAST SURGICAL HISTORY: No past surgical history on file.  FAMILY HISTORY No family history on file.     ADVANCED DIRECTIVES:    HEALTH MAINTENANCE: Social History  Substance Use Topics  . Smoking status: None  . Smokeless tobacco: None  . Alcohol Use: None     Colonoscopy:  PAP:  Bone density:  Lipid panel:  No Known Allergies  Current  Facility-Administered Medications  Medication Dose Route Frequency Provider Last Rate Last Dose  . 0.9 %  sodium chloride infusion   Intravenous Continuous Delfino Lovett, MD 125 mL/hr at 09/17/15 0913    . acetaminophen (TYLENOL) tablet 650 mg  650 mg Oral Q6H PRN Delfino Lovett, MD       Or  . acetaminophen (TYLENOL) suppository 650 mg  650 mg Rectal Q6H PRN Delfino Lovett, MD      . alum & mag hydroxide-simeth (MAALOX/MYLANTA) 200-200-20 MG/5ML suspension 30 mL  30 mL Oral Q6H PRN Delfino Lovett, MD      . bisacodyl (DULCOLAX) EC tablet 5 mg  5 mg Oral Daily PRN Delfino Lovett, MD      . cefTRIAXone (ROCEPHIN) 1 g in dextrose 5 % 50 mL IVPB  1 g Intravenous Q24H Irean Hong, MD   Stopped at 09/17/15 0408  . docusate sodium (COLACE) capsule 100 mg  100 mg Oral BID Delfino Lovett, MD      . heparin injection 5,000 Units  5,000 Units Subcutaneous 3 times per day Delfino Lovett, MD      . HYDROcodone-acetaminophen (NORCO/VICODIN) 5-325 MG per tablet 1-2 tablet  1-2 tablet Oral Q4H PRN Delfino Lovett, MD   2 tablet at 09/17/15 1330  . HYDROmorphone (DILAUDID) injection 1 mg  1 mg Intravenous Q4H PRN Delfino Lovett, MD   1 mg at 09/17/15 1201  . ondansetron (ZOFRAN) tablet 4 mg  4 mg Oral Q6H PRN Delfino Lovett, MD       Or  . ondansetron (ZOFRAN) injection 4 mg  4 mg Intravenous Q6H PRN Delfino Lovett, MD      .  traZODone (DESYREL) tablet 25 mg  25 mg Oral QHS PRN Delfino Lovett, MD        OBJECTIVE: Filed Vitals:   09/17/15 1045 09/17/15 1157  BP:  107/73  Pulse:  84  Temp: 98.5 F (36.9 C) 98.7 F (37.1 C)  Resp:  18     Body mass index is 32.14 kg/(m^2).    ECOG FS:0 - Asymptomatic  General: Moderate distress secondary to abdominal pain. Eyes: Pink conjunctiva, anicteric sclera. HEENT: Normocephalic, moist mucous membranes, clear oropharnyx. Lungs: Clear to auscultation bilaterally. Heart: Regular rate and rhythm. No rubs, murmurs, or gallops. Abdomen: Soft, nontender, nondistended. No organomegaly noted, normoactive bowel  sounds. Musculoskeletal: No edema, cyanosis, or clubbing. Neuro: Alert, answering all questions appropriately. Cranial nerves grossly intact. Skin: No rashes or petechiae noted. Psych: Normal affect. Lymphatics: No cervical, calvicular, axillary or inguinal LAD.   LAB RESULTS:  Lab Results  Component Value Date   NA 140 09/17/2015   K 3.4* 09/17/2015   CL 105 09/17/2015   CO2 26 09/17/2015   GLUCOSE 102* 09/17/2015   BUN 14 09/17/2015   CREATININE 1.14* 09/17/2015   CALCIUM 9.1 09/17/2015   PROT 7.9 09/17/2015   ALBUMIN 4.5 09/17/2015   AST 21 09/17/2015   ALT 14 09/17/2015   ALKPHOS 62 09/17/2015   BILITOT 0.5 09/17/2015   GFRNONAA >60 09/17/2015   GFRAA >60 09/17/2015    Lab Results  Component Value Date   WBC 13.1* 09/17/2015   NEUTROABS 7.8* 05/27/2014   HGB 13.2 09/17/2015   HCT 39.5 09/17/2015   MCV 77.6* 09/17/2015   PLT 228 09/17/2015     STUDIES: Ct Abdomen Pelvis W Contrast  09/17/2015  CLINICAL DATA:  Acute onset of upper abdominal pain. Initial encounter. EXAM: CT ABDOMEN AND PELVIS WITH CONTRAST TECHNIQUE: Multidetector CT imaging of the abdomen and pelvis was performed using the standard protocol following bolus administration of intravenous contrast. CONTRAST:  OMNIPAQUE IOHEXOL 300 MG/ML  SOLN COMPARISON:  Right upper quadrant ultrasound performed earlier today at 3:58 a.m. FINDINGS: The visualized lung bases are clear. The liver and spleen are unremarkable in appearance. The gallbladder is within normal limits. The pancreas and adrenal glands are unremarkable. The kidneys are unremarkable in appearance. There is no evidence of hydronephrosis. No renal or ureteral stones are seen. No perinephric stranding is appreciated. No free fluid is identified. The small bowel is unremarkable in appearance. The stomach is within normal limits. No acute vascular abnormalities are seen. Minimal calcification is noted along the distal abdominal aorta. There is an  unusual somewhat low density large clump of mesenteric nodes noted at the right mid abdomen, measuring 5.9 x 3.6 x 5.3 cm. This could reflect a lymphoproliferative disorder or possibly an unusual appearance to mesenteric adenitis. The appendix is normal in caliber, without evidence of appendicitis. The colon is unremarkable in appearance. The bladder is mildly distended and grossly remarkable. The uterus is unremarkable in appearance. A 4.4 cm left adnexal cystic focus is noted. The ovaries are otherwise unremarkable. No inguinal lymphadenopathy is seen. No acute osseous abnormalities are identified. IMPRESSION: 1. Unusual somewhat low-density large clump of mesenteric nodes at the right mid abdomen, measuring 5.9 x 3.6 x 5.3 cm. This could reflect a lymphoproliferative disorder, or given the patient's acute symptoms, might reflect an unusual appearance to mesenteric adenitis. Would correlate with the patient's labs, and consider PET/CT as deemed clinically appropriate, to help exclude malignancy. 2. 4.4 cm left adnexal cystic focus is likely physiologic,  though pelvic ultrasound could be considered for further evaluation on an elective nonemergent basis. 3. Minimal calcification along the distal abdominal aorta. This is somewhat advanced for age. Electronically Signed   By: Roanna Raider M.D.   On: 09/17/2015 05:39   US Abdomen Limited Ruq  09/17/2015  CLINICAL DATA:  Acute onset of right upper quadrant abdominal pain. Initial encounter. EXAM: US ABDOMEN LIMITED - RIGHT UPPER QUADRANT COMPARISON:  None. FINDINGS: Gallbladder: No gallstones or wall thickening visualized. No sonographic Murphy sign noted by sonographer. Common bile duct: Diameter: 0.4 cm, within normal limits in caliber. Liver: No focal lesion identified. Within normal limits in parenchymal echogenicity. IMPRESSION: Unremarkable ultrasound of the right upper quadrant. Electronically Signed   By: Roanna Raider M.D.   On: 09/17/2015 04:20     ASSESSMENT: Mesenteric nodal mass with significant abdominal pain.  PLAN:    1. Mesenteric nodal mass: Typically, malignancy does not present as acute onset pain. Despite this, CT scan is suspicious for possible lymphoproliferative disorder. I suspect PET scan or further imaging may not be of help since it likely will be positive with malignancy, inflammation, or infection. Agree with surgical and GI consult. Ultimately, patient may require require biopsy of the nodal mass to determine the etiology. Will order peripheral blood flow cytometry for completeness, although these results will not be back for approximately 3-4 days. 2. Pain: Patient was given Dilaudid and stated that it helped but only for approximately 20 minutes. She may benefit from a PCA pump in the near future.    Jeralyn Ruths, MD   09/17/2015 1:51 PM

## 2015-09-17 NOTE — Consult Note (Signed)
Please see full GI consult by Mrs. London. Patient seen and examined chart reviewed. Patient presenting with acute onset abdominal pain in the epigastrium and right upper quadrant. CT results noted with abnormal for of lymph nodes the right upper quadrant. Laboratories otherwise have been normal, with the exception of an elevated white count. It is of note she is also microcytic. On review of history patient also admits to taking daily times multiple doses of NSAIDs including Naprosyn and ibuprofen. Further evaluation per oncology noted. Also recommend EGD to rule out acute ulcer symptoms. We'll continue on a twice a day IV PPI. I have discussed the risks benefits and complications of procedures to include not limited to bleeding, infection, perforation and the risk of sedation and the patient wishes to proceed.

## 2015-09-17 NOTE — Consult Note (Addendum)
Patient ID: Carolyn Nunez, female   DOB: September 09, 1985, 30 y.o.   MRN: [id number]  Carolyn Carolyn Nunez is a 30 y.o. female   Carolyn Nunez is a 30 year old African-American female asked to see for severe abdominal pain patient reports she has developed severe abdominal pain since yesterday. Pain is diffuse 10 out of 10 sharp pain intermittent. She developed some nausea no vomiting. She never had any previous episodes like this. She is otherwise healthy. She does admit to have some substance abuse specifically cocaine and marijuana. She does smoke daily. No previous abdominal operations. CT scan personally reviewed there is no free air there is no pneumatosis. There is adenopathy on the right side concerning for mesenteric adenitis versus lymphoproliferative disorder. However her laboratory values does not so suport lympho-proliferative disorder or infarction disorder. Her white count is slightly elevated  History reviewed. No pertinent past medical history.  No past surgical history on file.  No family history on file.  Social History Social History  Substance Use Topics  . Smoking status: Current Some Day Smoker  . Smokeless tobacco: None  . Alcohol Use: None    No Known Allergies  Current Facility-Administered Medications  Medication Dose Route Frequency Provider Last Rate Last Dose  . 0.9 %  sodium chloride infusion   Intravenous Continuous Delfino Lovett, MD 125 mL/hr at 09/17/15 0913    . acetaminophen (TYLENOL) tablet 650 mg  650 mg Oral Q6H PRN Delfino Lovett, MD       Or  . acetaminophen (TYLENOL) suppository 650 mg  650 mg Rectal Q6H PRN Delfino Lovett, MD      . alum & mag hydroxide-simeth (MAALOX/MYLANTA) 200-200-20 MG/5ML suspension 30 mL  30 mL Oral Q6H PRN Delfino Lovett, MD      . bisacodyl (DULCOLAX) EC tablet 5 mg  5 mg Oral Daily PRN Delfino Lovett, MD      . cefTRIAXone (ROCEPHIN) 1 g in dextrose 5 % 50 mL IVPB  1 g Intravenous Q24H Irean Hong, MD   Stopped at 09/17/15 0408  . docusate  sodium (COLACE) capsule 100 mg  100 mg Oral BID Delfino Lovett, MD   100 mg at 09/17/15 1504  . heparin injection 5,000 Units  5,000 Units Subcutaneous 3 times per day Delfino Lovett, MD      . HYDROcodone-acetaminophen (NORCO/VICODIN) 5-325 MG per tablet 1-2 tablet  1-2 tablet Oral Q4H PRN Delfino Lovett, MD   2 tablet at 09/17/15 1330  . HYDROmorphone (DILAUDID) injection 1 mg  1 mg Intravenous Q2H PRN Delfino Lovett, MD   1 mg at 09/17/15 1428  . metroNIDAZOLE (FLAGYL) IVPB 500 mg  500 mg Intravenous Q8H Christiane Lexine Baton, NP   500 mg at 09/17/15 1607  . ondansetron (ZOFRAN) tablet 4 mg  4 mg Oral Q6H PRN Delfino Lovett, MD       Or  . ondansetron (ZOFRAN) injection 4 mg  4 mg Intravenous Q6H PRN Vipul Shah, MD      . pantoprazole (PROTONIX) injection 40 mg  40 mg Intravenous Q12H Christiane Lexine Baton, NP   40 mg at 09/17/15 1522  . traZODone (DESYREL) tablet 25 mg  25 mg Oral QHS PRN Delfino Lovett, MD         Review of Systems A 10 point review of systems was asked and was negative except for the information on the Carolyn  Physical Exam Blood pressure 107/73, pulse 84, temperature 98.7 F (37.1 C), temperature source Oral, resp. rate 18,  height  (1.549 m), weight 77.111 kg (170 lb), last menstrual period 09/16/2015, SpO2 100 %. CONSTITUTIONAL: She is in no acute distress his stress. She does not make eye contact EYES: Pupils are equal, round, and reactive to light, Sclera are non-icteric. EARS, NOSE, MOUTH AND THROAT: The oropharynx is clear. The oral mucosa is pink and moist. Hearing is intact to voice. LYMPH NODES:  Lymph nodes in the neck are normal. RESPIRATORY:  Lungs are clear. There is normal respiratory effort, with equal breath sounds bilaterally, and without pathologic use of accessory muscles. CARDIOVASCULAR: Heart is regular without murmurs, gallops, or rubs. GI: The abdomen is  soft,  and nondistended. There are no palpable masses. There is no hepatosplenomegaly. There are normal  bowel sounds in all quadrants. Mild diffuse abdominal tenderness, no peritoneal signs. She is presses that she hurts actually more on the left side GU: Rectal deferred.   MUSCULOSKELETAL: Normal muscle strength and tone. No cyanosis or edema.   SKIN: Turgor is good and there are no pathologic skin lesions or ulcers. NEUROLOGIC: Motor and sensation is grossly normal. Cranial nerves are grossly intact. PSYCH:  Oriented to person, place and time. Affect is normal.  Data Reviewed I have personally reviewed the patient's imaging, laboratory findings and medical records.    Assessment/Plan  Young female with acute abdominal pain differential diagnoses include mesenteric adenitis I'm not sure if she might be withdrawing or might be related to any polysubstance abuse. I will go ahead and her drug screen test . No evidence of any surgical disease at this point in time. Scars with the patient and with the mother in detail. I would not recommend diagnostic laparoscopy or any invasive surgical procedure at this time. May benefit from a GI evaluation to or The etiology of the pain. Extensive counseling provided  Sterling Big, MD FACS General Surgeon 09/17/2015, 4:54 PM

## 2015-09-17 NOTE — Progress Notes (Signed)
Both consulting physicians notified Rhett Bannister and Marva Panda, MD

## 2015-09-17 NOTE — ED Provider Notes (Signed)
[facility] Emergency Department Provider Note  ____________________________________________  Time seen: Approximately 3:16 AM  I have reviewed the triage vital signs and the nursing notes.   HISTORY  Chief Complaint Abdominal Pain    HPI Carolyn Nunez is a 30 y.o. female who presents to the ED from home with a chief complaint of upper abdominal pain. Patient describes sharp epigastric and right upper quadrant nonradiating pain which began after eating fried chicken gizzards this evening. Symptoms associated with vomiting once. Also complains of suprapubic discomfort times several days. Denies associated symptoms of fever, chills, chest pain, shortness of breath, diarrhea. Denies recent travel or trauma. Nothing makes her symptoms better or worse.   Past medical history None  Patient Active Problem List   Diagnosis Date Noted  . Abdominal pain 09/17/2015    Past surgical history C-section  No current outpatient prescriptions on file.  Allergies Review of patient's allergies indicates no known allergies.  No family history on file.  Social History Social History  Substance Use Topics  . Smoking status: None  . Smokeless tobacco: None  . Alcohol Use: None  Denies illicit drug use  Review of Systems  Constitutional: No fever/chills. Eyes: No visual changes. ENT: No sore throat. Cardiovascular: Denies chest pain. Respiratory: Denies shortness of breath. Gastrointestinal: Positive for abdominal pain, nausea and vomiting.  No diarrhea.  No constipation. Genitourinary: Negative for dysuria. Musculoskeletal: Negative for back pain. Skin: Negative for rash. Neurological: Negative for headaches, focal weakness or numbness.  10-point ROS otherwise negative.  ____________________________________________   PHYSICAL EXAM:  VITAL SIGNS: ED Triage Vitals  Enc Vitals Group     BP 09/17/15 0203 130/99 mmHg     Pulse Rate 09/17/15 0203 107      Resp 09/17/15 0203 18     Temp 09/17/15 0203 99.1 F (37.3 C)     Temp Source 09/17/15 0203 Oral     SpO2 09/17/15 0203 100 %     Weight 09/17/15 0203 170 lb (77.111 kg)     Height 09/17/15 0203  (1.549 m)     Head Cir --      Peak Flow --      Pain Score 09/17/15 0204 10     Pain Loc --      Pain Edu? --      Excl. in GC? --     Constitutional: Alert and oriented. Well appearing and in mild acute distress. Eyes: Conjunctivae are normal. PERRL. EOMI. Head: Atraumatic. Nose: No congestion/rhinnorhea. Mouth/Throat: Mucous membranes are moist.  Oropharynx non-erythematous. Neck: No stridor.   Cardiovascular: Normal rate, regular rhythm. Grossly normal heart sounds.  Good peripheral circulation. Respiratory: Normal respiratory effort.  No retractions. Lungs CTAB. Gastrointestinal: Soft and moderately tender to palpation epigastrium, right upper quadrant and suprapubic areas without rebound or guarding. No distention. No abdominal bruits. No CVA tenderness. Musculoskeletal: No lower extremity tenderness nor edema.  No joint effusions. Neurologic:  Normal speech and language. No gross focal neurologic deficits are appreciated. No gait instability. Skin:  Skin is warm, dry and intact. No rash noted. Psychiatric: Mood and affect are normal. Speech and behavior are normal.  ____________________________________________   LABS (all labs ordered are listed, but only abnormal results are displayed)  Labs Reviewed  CBC - Abnormal; Notable for the following:    WBC 13.1 (*)    MCV 77.6 (*)    RDW 15.2 (*)    All other components within normal limits  COMPREHENSIVE METABOLIC  PANEL - Abnormal; Notable for the following:    Potassium 3.4 (*)    Glucose, Bld 102 (*)    Creatinine, Ser 1.14 (*)    All other components within normal limits  URINALYSIS COMPLETEWITH MICROSCOPIC ([facility] ONLY) - Abnormal; Notable for the following:    Color, Urine YELLOW (*)    APPearance CLEAR (*)     Ketones, ur TRACE (*)    Specific Gravity, Urine 1.031 (*)    Hgb urine dipstick 1+ (*)    Protein, ur 30 (*)    Leukocytes, UA 2+ (*)    Bacteria, UA RARE (*)    Squamous Epithelial / LPF 0-5 (*)    All other components within normal limits  LIPASE, BLOOD  PREGNANCY, URINE  ETHANOL   ____________________________________________  EKG  None ____________________________________________  RADIOLOGY  Ultrasound abdomen interpreted per Dr. Cherly Hensen: Unremarkable ultrasound of the right upper quadrant.  CT abdomen/pelvis interpreted per Dr. Cherly Hensen: 1. Unusual somewhat low-density large clump of mesenteric nodes at the right mid abdomen, measuring 5.9 x 3.6 x 5.3 cm. This could reflect a lymphoproliferative disorder, or given the patient's acute symptoms, might reflect an unusual appearance to mesenteric adenitis. Would correlate with the patient's labs, and consider PET/CT as deemed clinically appropriate, to help exclude malignancy. 2. 4.4 cm left adnexal cystic focus is likely physiologic, though pelvic ultrasound could be considered for further evaluation on an elective nonemergent basis. 3. Minimal calcification along the distal abdominal aorta. This is somewhat advanced for age. ____________________________________________   PROCEDURES  Procedure(s) performed: None  Critical Care performed:   CRITICAL CARE Performed by: Irean Hong   Total critical care time: 30 minutes  Critical care time was exclusive of separately billable procedures and treating other patients.  Critical care was necessary to treat or prevent imminent or life-threatening deterioration.  Critical care was time spent personally by me on the following activities: development of treatment plan with patient and/or surrogate as well as nursing, discussions with consultants, evaluation of patient's response to treatment, examination of patient, obtaining history from patient or surrogate, ordering and  performing treatments and interventions, ordering and review of laboratory studies, ordering and review of radiographic studies, pulse oximetry and re-evaluation of patient's condition.  ____________________________________________   INITIAL IMPRESSION / ASSESSMENT AND PLAN / ED COURSE  Pertinent labs & imaging results that were available during my care of the patient were reviewed by me and considered in my medical decision making (see chart for details).  30 year old female who presents with upper abdominal pain after eating fried food as well as suprapubic discomfort. Laboratory urinalysis results notable for mild leukocytosis, normal LFTs and lipase, and urine consistent with UTI. Will initiate IV fluid resuscitation, IV analgesia and antiemetic and obtain right upper quadrant ultrasound to evaluate for cholecystitis. Will start IV Rocephin for UTI.  ----------------------------------------- 4:29 AM on 09/17/2015 -----------------------------------------  Updated patient and family members of negative ultrasound. Unclear etiology at this point of patient's upper abdominal pain. Will obtain CT abdomen/pelvis given patient's severe abdominal pain on presentation.  ----------------------------------------- 5:02 AM on 09/17/2015 -----------------------------------------  Patient is crying, writhing in pain. Appears to be having spasms of pain so I will try IV Valium. She is drinking oral contrast in preparation for her CT scan.  ----------------------------------------- 6:33 AM on 09/17/2015 -----------------------------------------  Updated patient of CT imaging results. Patient denies family history of leukemia or lymphoma. Patient continues to experience severe 9/10 abdominal pain/spasms associated with now dry heaves. Will re-dose IV Valium and  discuss with hospitalist for admission for intractable abdominal pain with nausea and  vomiting. ____________________________________________   FINAL CLINICAL IMPRESSION(S) / ED DIAGNOSES  Final diagnoses:  Pain of upper abdomen  UTI (lower urinary tract infection)  Enlarged lymph nodes  Intractable vomiting with nausea, vomiting of unspecified type  Intractable abdominal pain      Irean Hong, MD 09/17/15 [phone]

## 2015-09-17 NOTE — H&P (Signed)
[facility] Physicians - [place] at [facility]   PATIENT NAME: Carolyn Nunez    MR#:  [id number]  DATE OF BIRTH:  17-Jan-1986  DATE OF ADMISSION:  09/17/2015  PRIMARY CARE PHYSICIAN: No PCP Per Patient   REQUESTING/REFERRING PHYSICIAN: Chiquita Loth MD  CHIEF COMPLAINT:   Chief Complaint  Patient presents with  . Abdominal Pain    HISTORY OF PRESENT ILLNESS:  Carolyn Nunez  is a 30 y.o. female with no known medical problems admitted for Acute uncontrolled abd pain. Patient describes sharp epigastric and right upper quadrant nonradiating pain which began after eating fried chicken gizzards yesterday evening. Symptoms associated with vomiting once then and subsequently 1-2 times today. She states her emesis was green in nature. Never felt like this before and the pain has been unbearable. Also complains of suprapubic discomfort times several days. Denies associated symptoms of fever, chills, chest pain, shortness of breath, diarrhea. Denies recent travel or trauma. Nothing makes her symptoms better or worse.   Did have unremarkable RUQ [place] in the ED and a contrasted CT of the abdomen/pelvis which was pretty unremarkable with the exception of a left adnexal cyst and an unusual somewhat low density large clump of mesenteric nodes noted at the right mid abdomen, measuring 5.9 x 3.6 x 5.3 cm. She is requiring quite a bit of pain medicine. Mother and mother's work boss at bedside. PAST MEDICAL HISTORY:  History reviewed. No pertinent past medical history.  PAST SURGICAL HISTORY:  No past surgical history on file.  SOCIAL HISTORY:   Social History  Substance Use Topics  . Smoking status: Current Some Day Smoker  . Smokeless tobacco: Not on file  . Alcohol Use: Not on file  has 1 healthy boy, 1 abortion 2 yrs ago. Works as Education officer, environmental person at [employer] FAMILY HISTORY:  No family history on file. Dad: cancer Mom: lupus DRUG ALLERGIES:  No Known Allergies  REVIEW OF SYSTEMS:    Review of Systems  Constitutional: Negative for fever, weight loss, malaise/fatigue and diaphoresis.  HENT: Negative for ear discharge, ear pain, hearing loss, nosebleeds, sore throat and tinnitus.   Eyes: Negative for blurred vision and pain.  Respiratory: Negative for cough, hemoptysis, shortness of breath and wheezing.   Cardiovascular: Negative for chest pain, palpitations, orthopnea and leg swelling.  Gastrointestinal: Positive for vomiting and abdominal pain. Negative for heartburn, nausea, diarrhea, constipation and blood in stool.  Genitourinary: Negative for dysuria, urgency and frequency.  Musculoskeletal: Negative for myalgias and back pain.  Skin: Negative for itching and rash.  Neurological: Negative for dizziness, tingling, tremors, focal weakness, seizures, weakness and headaches.  Psychiatric/Behavioral: Negative for depression. The patient is not nervous/anxious.     MEDICATIONS AT HOME:   Prior to Admission medications   Not on File      VITAL SIGNS:  Blood pressure 107/73, pulse 84, temperature 98.7 F (37.1 C), temperature source Oral, resp. rate 18, height 5\' 1"  (1.549 m), weight 77.111 kg (170 lb), last menstrual period 09/16/2015, SpO2 100 %.  PHYSICAL EXAMINATION:  Physical Exam  GENERAL:  30 y.o.-year-old patient lying in the bed with no acute distress.  EYES: Pupils equal, round, reactive to light and accommodation. No scleral icterus. Extraocular muscles intact.  HEENT: Head atraumatic, normocephalic. Oropharynx and nasopharynx clear.  NECK:  Supple, no jugular venous distention. No thyroid enlargement, no tenderness.  LUNGS: Normal breath sounds bilaterally, no wheezing, rales,rhonchi or crepitation. No use of accessory muscles of respiration.  CARDIOVASCULAR: S1, S2 normal.  No murmurs, rubs, or gallops.  ABDOMEN: Soft,  Generalized tenderness, nondistended. Bowel sounds present. No organomegaly or mass.  EXTREMITIES: No pedal edema, cyanosis, or  clubbing.  NEUROLOGIC: Cranial nerves II through XII are intact. Muscle strength 5/5 in all extremities. Sensation intact. Gait not checked.  PSYCHIATRIC: The patient is alert and oriented x 3. Anxious SKIN: No obvious rash, lesion, or ulcer.   LABORATORY PANEL:   CBC  Recent Labs Lab 09/17/15 0231  WBC 13.1*  HGB 13.2  HCT 39.5  PLT 228   ------------------------------------------------------------------------------------------------------------------  Chemistries   Recent Labs Lab 09/17/15 0231  NA 140  K 3.4*  CL 105  CO2 26  GLUCOSE 102*  BUN 14  CREATININE 1.14*  CALCIUM 9.1  AST 21  ALT 14  ALKPHOS 62  BILITOT 0.5   ------------------------------------------------------------------------------------------------------------------  Cardiac Enzymes No results for input(s): TROPONINI in the last 168 hours. ------------------------------------------------------------------------------------------------------------------  RADIOLOGY:  Ct Abdomen Pelvis W Contrast  09/17/2015  CLINICAL DATA:  Acute onset of upper abdominal pain. Initial encounter. EXAM: CT ABDOMEN AND PELVIS WITH CONTRAST TECHNIQUE: Multidetector CT imaging of the abdomen and pelvis was performed using the standard protocol following bolus administration of intravenous contrast. CONTRAST:  OMNIPAQUE IOHEXOL 300 MG/ML  SOLN COMPARISON:  Right upper quadrant ultrasound performed earlier today at 3:58 a.m. FINDINGS: The visualized lung bases are clear. The liver and spleen are unremarkable in appearance. The gallbladder is within normal limits. The pancreas and adrenal glands are unremarkable. The kidneys are unremarkable in appearance. There is no evidence of hydronephrosis. No renal or ureteral stones are seen. No perinephric stranding is appreciated. No free fluid is identified. The small bowel is unremarkable in appearance. The stomach is within normal limits. No acute vascular abnormalities are  seen. Minimal calcification is noted along the distal abdominal aorta. There is an unusual somewhat low density large clump of mesenteric nodes noted at the right mid abdomen, measuring 5.9 x 3.6 x 5.3 cm. This could reflect a lymphoproliferative disorder or possibly an unusual appearance to mesenteric adenitis. The appendix is normal in caliber, without evidence of appendicitis. The colon is unremarkable in appearance. The bladder is mildly distended and grossly remarkable. The uterus is unremarkable in appearance. A 4.4 cm left adnexal cystic focus is noted. The ovaries are otherwise unremarkable. No inguinal lymphadenopathy is seen. No acute osseous abnormalities are identified. IMPRESSION: 1. Unusual somewhat low-density large clump of mesenteric nodes at the right mid abdomen, measuring 5.9 x 3.6 x 5.3 cm. This could reflect a lymphoproliferative disorder, or given the patient's acute symptoms, might reflect an unusual appearance to mesenteric adenitis. Would correlate with the patient's labs, and consider PET/CT as deemed clinically appropriate, to help exclude malignancy. 2. 4.4 cm left adnexal cystic focus is likely physiologic, though pelvic ultrasound could be considered for further evaluation on an elective nonemergent basis. 3. Minimal calcification along the distal abdominal aorta. This is somewhat advanced for age. Electronically Signed   By: Roanna Raider M.D.   On: 09/17/2015 05:39   US Abdomen Limited Ruq  09/17/2015  CLINICAL DATA:  Acute onset of right upper quadrant abdominal pain. Initial encounter. EXAM: US ABDOMEN LIMITED - RIGHT UPPER QUADRANT COMPARISON:  None. FINDINGS: Gallbladder: No gallstones or wall thickening visualized. No sonographic Murphy sign noted by sonographer. Common bile duct: Diameter: 0.4 cm, within normal limits in caliber. Liver: No focal lesion identified. Within normal limits in parenchymal echogenicity. IMPRESSION: Unremarkable ultrasound of the right upper  quadrant. Electronically Signed  By: Roanna Raider M.D.   On: 09/17/2015 04:20   IMPRESSION AND PLAN:  30 y.o. female with no known medical problems admitted for Acute uncontrolled abd pain  * Acute abd pain: of unknown etio at this time. CT shows large mesenteric lymph nodes. C/s surgery, onco and GI. D/w all. Symptomatic mgmt for now as unsure exact etio. With no fever, will hold off abx at this time.   * Leukocytosis: likely stress induced. No fever  * Hypokalemia: replete and recheck  * Lymphoproliferative disorder: further mgmt per onco.    All the records are reviewed and case discussed with ED provider. Management plans discussed with the patient, family and they are in agreement.  CODE STATUS: FULL CODE  TOTAL TIME TAKING CARE OF THIS PATIENT: 45 minutes.    [facility], Carolyn Nunez M.D on 09/17/2015 at 3:35 PM  Between 7am to 6pm - Pager - [phone]  After 6pm go to [email or web address] - password [email or web address] [facility]  [place] [facility]  Office  [phone]  CC: Primary care physician; No PCP Per Patient   Note: This dictation was prepared with Dragon dictation along with smaller phrase technology. Any transcriptional errors that result from this process are unintentional.

## 2015-09-17 NOTE — ED Notes (Signed)
Pt resting in bed, eyes closed, resp even and unlabored

## 2015-09-18 ENCOUNTER — Observation Stay: Payer: MEDICAID | Admitting: Anesthesiology

## 2015-09-18 ENCOUNTER — Encounter: Payer: Self-pay | Admitting: Anesthesiology

## 2015-09-18 ENCOUNTER — Encounter
Admission: EM | Disposition: A | Discharge status: Home / Self Care | Payer: Self-pay | Source: Home / Self Care | Attending: Emergency Medicine

## 2015-09-18 LAB — URINE DRUG SCREEN, QUALITATIVE (ARMC ONLY)
Amphetamines, Ur Screen: NOT DETECTED
BARBITURATES, UR SCREEN: NOT DETECTED
Benzodiazepine, Ur Scrn: POSITIVE — AB
CANNABINOID 50 NG, UR ~~LOC~~: POSITIVE — AB
COCAINE METABOLITE, UR ~~LOC~~: POSITIVE — AB
MDMA (Ecstasy)Ur Screen: NOT DETECTED
Methadone Scn, Ur: NOT DETECTED
OPIATE, UR SCREEN: POSITIVE — AB
Phencyclidine (PCP) Ur S: NOT DETECTED
Tricyclic, Ur Screen: NOT DETECTED

## 2015-09-18 LAB — CBC
HEMATOCRIT: 33.4 % — AB (ref 35.0–47.0)
Hemoglobin: 11 g/dL — ABNORMAL LOW (ref 12.0–16.0)
MCH: 26.3 pg (ref 26.0–34.0)
MCHC: 32.9 g/dL (ref 32.0–36.0)
MCV: 80 fL (ref 80.0–100.0)
Platelets: 183 10*3/uL (ref 150–440)
RBC: 4.18 MIL/uL (ref 3.80–5.20)
RDW: 15.6 % — AB (ref 11.5–14.5)
WBC: 8.9 10*3/uL (ref 3.6–11.0)

## 2015-09-18 LAB — BASIC METABOLIC PANEL
Anion gap: 5 (ref 5–15)
BUN: 11 mg/dL (ref 6–20)
CHLORIDE: 107 mmol/L (ref 101–111)
CO2: 27 mmol/L (ref 22–32)
Calcium: 7.9 mg/dL — ABNORMAL LOW (ref 8.9–10.3)
Creatinine, Ser: 0.81 mg/dL (ref 0.44–1.00)
GFR calc Af Amer: >60 mL/min (ref >60)
GFR calc non Af Amer: >60 mL/min (ref >60)
Glucose, Bld: 85 mg/dL (ref 65–99)
POTASSIUM: 3 mmol/L — AB (ref 3.5–5.1)
SODIUM: 139 mmol/L (ref 135–145)

## 2015-09-18 SURGERY — ESOPHAGOGASTRODUODENOSCOPY (EGD) WITH PROPOFOL
Anesthesia: General

## 2015-09-18 MED ORDER — OMEPRAZOLE 20 MG PO TBEC
40.0000 mg | DELAYED_RELEASE_TABLET | Freq: Two times a day (BID) | ORAL | Status: DC
Start: 2015-09-18 — End: 2021-05-10

## 2015-09-18 MED ORDER — HYDROCODONE-ACETAMINOPHEN 5-325 MG PO TABS
1.0000 | ORAL_TABLET | Freq: Four times a day (QID) | ORAL | Status: DC | PRN
  Administered 2015-09-18 (×2): 1 via ORAL
  Filled 2015-09-18 (×2): qty 1

## 2015-09-18 MED ORDER — PROMETHAZINE HCL 25 MG/ML IJ SOLN: 12.5000 mg | Freq: Four times a day (QID) | INTRAMUSCULAR | Status: DC | PRN

## 2015-09-18 MED ORDER — PROMETHAZINE HCL 25 MG/ML IJ SOLN
INTRAMUSCULAR | Status: AC
  Administered 2015-09-18: 25 mg
  Filled 2015-09-18: qty 1

## 2015-09-18 NOTE — Discharge Instructions (Signed)

## 2015-09-18 NOTE — Progress Notes (Signed)
Notified San Jetty, Rn of temp. Pt has many blankets but wants to keep all, notified San Jetty, Rn

## 2015-09-18 NOTE — Anesthesia Preprocedure Evaluation (Deleted)
Anesthesia Evaluation  Patient identified by MRN, date of birth, ID band Patient awake    Reviewed: Allergy & Precautions, NPO status , Patient's Chart, lab work & pertinent test results, reviewed documented beta blocker date and time   Airway Mallampati: II  TM Distance: >3 FB     Dental  (+) Chipped   Pulmonary Current Smoker,           Cardiovascular      Neuro/Psych    GI/Hepatic   Endo/Other    Renal/GU      Musculoskeletal   Abdominal   Peds  Hematology   Anesthesia Other Findings Obese.  Reproductive/Obstetrics                             Anesthesia Physical Anesthesia Plan  ASA: II  Anesthesia Plan: General   Post-op Pain Management:    Induction: Intravenous  Airway Management Planned: Nasal Cannula  Additional Equipment:   Intra-op Plan:   Post-operative Plan:   Informed Consent: I have reviewed the patients History and Physical, chart, labs and discussed the procedure including the risks, benefits and alternatives for the proposed anesthesia with the patient or authorized representative who has indicated his/her understanding and acceptance.     Plan Discussed with: CRNA  Anesthesia Plan Comments:         Anesthesia Quick Evaluation

## 2015-09-19 NOTE — Discharge Summary (Signed)
[facility] Phys[facility] - [place] at [facility]   PATIENT NAME: Carolyn Nunez    MR#:  [id number]  DATE OF BIRTH:  07/28/1985  DATE OF ADMISSION:  09/17/2015 ADMITTING PHYSICIAN: Delfino Lovett, MD  DATE OF DISCHARGE: 09/18/2015  5:02 PM  PRIMARY CARE PHYSICIAN: No PCP Per Patient    ADMISSION DIAGNOSIS:  Enlarged lymph nodes [R59.1] UTI (lower urinary tract infection) [N39.0] Pain of upper abdomen [R10.10] Intractable abdominal pain [R10.9] Intractable vomiting with nausea, vomiting of unspecified type [R11.10]  DISCHARGE DIAGNOSIS:  Active Problems:   Abdominal pain lymphatic mass in adbomen  SECONDARY DIAGNOSIS:  History reviewed. No pertinent past medical history.  HOSPITAL COURSE:  30 y.o. female with no known medical problems admitted for Acute uncontrolled abd pain. Please see Dr Carolyn Nunez dictated H & P for further details. Her symptoms were not consistent with her labs, vitals and objective findings like radiology.  GI, SURGERY & ONCO as her CT showed low density large clump of mesenteric nodes noted at the right mid abdomen, measuring 5.9 x 3.6 x 5.3 cm. This was likely incidental and she was requested to f/up out pt with onco for further eval.  After thourugh eval by all consultants this was felt to be nothing like acute abdomen. She certainly had UDS + for cocaine, benzo, cannabinoid and opiate. GI was initially planning to EGD but later cancelled and wanted this drugs to be washed out which may take couple days so decided to do this as an outpt. Patient was tolerating diet and pain was much better controlled. She was D/C home in stable condition.  DISCHARGE CONDITIONS:   stable  CONSULTS OBTAINED:     DRUG ALLERGIES:  No Known Allergies  DISCHARGE MEDICATIONS:   Discharge Medication List as of 09/18/2015  4:13 PM    START taking these medications   Details  Omeprazole 20 MG TBEC Take 2 tablets (40 mg total) by mouth 2 (two) times daily., Starting  09/18/2015, Until Discontinued, Print         DISCHARGE INSTRUCTIONS:    DIET:  Regular diet  DISCHARGE CONDITION:  Good  ACTIVITY:  Activity as tolerated  OXYGEN:  Home Oxygen: No.   Oxygen Delivery: room air  DISCHARGE LOCATION:  home   If you experience worsening of your admission symptoms, develop shortness of breath, life threatening emergency, suicidal or homicidal thoughts you must seek medical attention immediately by calling 911 or calling your MD immediately  if symptoms less severe.  You Must read complete instructions/literature along with all the possible adverse reactions/side effects for all the Medicines you take and that have been prescribed to you. Take any new Medicines after you have completely understood and accpet all the possible adverse reactions/side effects.   Please note  You were cared for by a hospitalist during your hospital stay. If you have any questions about your discharge medications or the care you received while you were in the hospital after you are discharged, you can call the unit and asked to speak with the hospitalist on call if the hospitalist that took care of you is not available. Once you are discharged, your primary care physician will handle any further medical issues. Please note that NO REFILLS for any discharge medications will be authorized once you are discharged, as it is imperative that you return to your primary care physician (or establish a relationship with a primary care physician if you do not have one) for your aftercare needs so that they  can reassess your need for medications and monitor your lab values.    On the day of Discharge:  VITAL SIGNS:  Blood pressure 121/77, pulse 79, temperature 98.9 F (37.2 C), temperature source Oral, resp. rate 20, height  (1.549 m), weight 77.111 kg (170 lb), last menstrual period 09/16/2015, SpO2 100 %.  PHYSICAL EXAMINATION:  GENERAL:  30 y.o.-year-old patient lying in the  bed with no acute distress.  EYES: Pupils equal, round, reactive to light and accommodation. No scleral icterus. Extraocular muscles intact.  HEENT: Head atraumatic, normocephalic. Oropharynx and nasopharynx clear.  NECK:  Supple, no jugular venous distention. No thyroid enlargement, no tenderness.  LUNGS: Normal breath sounds bilaterally, no wheezing, rales,rhonchi or crepitation. No use of accessory muscles of respiration.  CARDIOVASCULAR: S1, S2 normal. No murmurs, rubs, or gallops.  ABDOMEN: Soft, non-tender, non-distended. Bowel sounds present. No organomegaly or mass.  EXTREMITIES: No pedal edema, cyanosis, or clubbing.  NEUROLOGIC: Cranial nerves II through XII are intact. Muscle strength 5/5 in all extremities. Sensation intact. Gait not checked.  PSYCHIATRIC: The patient is alert and oriented x 3.  SKIN: No obvious rash, lesion, or ulcer.  DATA REVIEW:   CBC  Recent Labs Lab 09/18/15 0608  WBC 8.9  HGB 11.0*  HCT 33.4*  PLT 183    Chemistries   Recent Labs Lab 09/17/15 0231 09/18/15 0608  NA 140 139  K 3.4* 3.0*  CL 105 107  CO2 26 27  GLUCOSE 102* 85  BUN 14 11  CREATININE 1.14* 0.81  CALCIUM 9.1 7.9*  AST 21  --   ALT 14  --   ALKPHOS 62  --   BILITOT 0.5  --     Cardiac Enzymes No results for input(s): TROPONINI in the last 168 hours.  Microbiology Results  Results for orders placed or performed in visit on 07/31/13  Urine culture     Status: None   Collection Time: 07/31/13  6:42 PM  Result Value Ref Range Status   Micro Text Report   Final       SOURCE: CLEAN CATCH    COMMENT                   MIXED BACTERIAL ORGANISMS   COMMENT                   RESULTS SUGGESTIVE OF CONTAMINATION   ANTIBIOTIC                                                      GC/Chlamydia Probe Amp     Status: None   Collection Time: 07/31/13  8:33 PM  Result Value Ref Range Status   Micro Text Report   Final       CHLAMYDIA                 CHLAMYDIA TRACHOMATIS  NEGATIVE   N.GONORRHOEAE             N.GONORRHOEAE NEGATIVE   ANTIBIOTIC                                                      Wet prep, genital  Status: None   Collection Time: 07/31/13  8:33 PM  Result Value Ref Range Status   Micro Text Report   Final       COMMENT                   FEW WHITE BLOOD CELLS SEEN   COMMENT                   CLUE CELLS SEEN   COMMENT                   NO TRICHOMONAS SEEN   COMMENT                   NO YEAST SEEN   COMMENT                   NO SPERMATOZOA SEEN   ANTIBIOTIC                                                        RADIOLOGY:  No results found.   Management plans discussed with the patient, family and they are in agreement.  CODE STATUS:  Code Status History    Date Active Date Inactive Code Status Order ID Comments User Context   09/17/2015  8:24 AM 09/18/2015  8:03 PM Full Code [id number]  Delfino Lovett, MD Inpatient     Follow-up Information    Follow up with Christena Deem, MD. Schedule an appointment as soon as possible for a visit in 1 week.   Specialty:  Gastroenterology   Why:  [facility] Discharge F/UP   Contact information:   [address] [facility] [place] [address] [phone]       Follow up with Jeralyn Ruths, MD. Schedule an appointment as soon as possible for a visit in 2 weeks.   Specialty:  Oncology   Why:  [facility] Discharge F/UP   Contact information:   [address] [place] [address] [phone]        TOTAL TIME TAKING CARE OF THIS PATIENT: 45 minutes.    [facility], Billye Pickerel M.D on 09/19/2015 at 5:02 PM  Between 7am to 6pm - Pager - [phone]  After 6pm go to [email or web address] - password [email or web address] [facility]  [place] [facility]  Office  [phone]  CC: Primary care physician; No PCP Per Patient Jeralyn Ruths, MD Christena Deem, MD Leafy Ro, MD  Note: This dictation was prepared with Dragon dictation along with smaller  phrase technology. Any transcriptional errors that result from this process are unintentional.

## 2015-09-20 LAB — PORPHOBILINOGEN, RANDOM URINE: QUANTITATIVE PORPHOBILINOGEN: 0.4 mg/L (ref 0.0–2.0)

## 2015-09-20 LAB — COMP PANEL: LEUKEMIA/LYMPHOMA

## 2015-12-06 ENCOUNTER — Encounter: Payer: Self-pay | Admitting: *Deleted

## 2015-12-06 ENCOUNTER — Emergency Department
Admission: EM | Admit: 2015-12-06 | Discharge: 2015-12-06 | Disposition: A | Discharge status: Home / Self Care | Payer: Self-pay | Attending: Emergency Medicine | Admitting: Emergency Medicine

## 2015-12-06 DIAGNOSIS — F17200 Nicotine dependence, unspecified, uncomplicated: Secondary | ICD-10-CM | POA: Insufficient documentation

## 2015-12-06 DIAGNOSIS — H9201 Otalgia, right ear: Secondary | ICD-10-CM | POA: Insufficient documentation

## 2015-12-06 MED ORDER — AMOXICILLIN 500 MG PO CAPS
500.0000 mg | ORAL_CAPSULE | Freq: Once | ORAL | Status: AC
  Administered 2015-12-06: 500 mg via ORAL
  Filled 2015-12-06: qty 1

## 2015-12-06 MED ORDER — AMOXICILLIN 500 MG PO TABS: 500.0000 mg | ORAL_TABLET | Freq: Three times a day (TID) | ORAL | Status: DC

## 2015-12-06 NOTE — Discharge Instructions (Signed)
Earache An earache, also called otalgia, can be caused by many things. Pain from an earache can be sharp, dull, or burning. The pain may be temporary or constant. Earaches can be caused by problems with the ear, such as infection in either the middle ear or the ear canal, injury, impacted ear wax, middle ear pressure, or a foreign body in the ear. Ear pain can also result from problems in other areas. This is called referred pain. For example, pain can come from a sore throat, a tooth infection, or problems with the jaw or the joint between the jaw and the skull (temporomandibular joint, or TMJ). The cause of an earache is not always easy to identify. Watchful waiting may be appropriate for some earaches until a clear cause of the pain can be found. HOME CARE INSTRUCTIONS Watch your condition for any changes. The following actions may help to lessen any discomfort that you are feeling:  Take medicines only as directed by your health care provider. This includes ear drops.  Apply ice to your outer ear to help reduce pain.  Put ice in a plastic bag.  Place a towel between your skin and the bag.  Leave the ice on for 20 minutes, 2-3 times per day.  Do not put anything in your ear other than medicine that is prescribed by your health care provider.  Try resting in an upright position instead of lying down. This may help to reduce pressure in the middle ear and relieve pain.  Chew gum if it helps to relieve your ear pain.  Control any allergies that you have.  Keep all follow-up visits as directed by your health care provider. This is important. SEEK MEDICAL CARE IF:  Your pain does not improve within 2 days.  You have a fever.  You have new or worsening symptoms. SEEK IMMEDIATE MEDICAL CARE IF:  You have a severe headache.  You have a stiff neck.  You have difficulty swallowing.  You have redness or swelling behind your ear.  You have drainage from your ear.  You have hearing  loss.  You feel dizzy.   This information is not intended to replace advice given to you by your health care provider. Make sure you discuss any questions you have with your health care provider.   Document Released: 02/21/2004 Document Revised: 07/27/2014 Document Reviewed: 02/04/2014 Elsevier Interactive Patient Education [employer]2016 [employer]   I am concerned you may have the beginnings of an ear infection. Continue Flonase. Start antibiotics. Follow-up with the ENT physician if not improving.

## 2015-12-06 NOTE — ED Provider Notes (Signed)
[facility] Emergency Department Provider Note  ____________________________________________  Time seen: Approximately 7:28 PM  I have reviewed the triage vital signs and the nursing notes.   HISTORY  Chief Complaint Otalgia    HPI Carolyn Nunez is a 30 y.o. female who presents with 1-1/2 week history of right ear pain. Also has sinus congestion. Started with a sore throat. No chest congestion or cough. No fevers or chills.   History reviewed. No pertinent past medical history.  Patient Active Problem List   Diagnosis Date Noted  . Abdominal pain 09/17/2015    History reviewed. No pertinent past surgical history.  Current Outpatient Rx  Name  Route  Sig  Dispense  Refill  . amoxicillin (AMOXIL) 500 MG tablet   Oral   Take 1 tablet (500 mg total) by mouth 3 (three) times daily.   30 tablet   0   . Omeprazole 20 MG TBEC   Oral   Take 2 tablets (40 mg total) by mouth 2 (two) times daily.   60 each   0     Allergies Review of patient's allergies indicates no known allergies.  No family history on file.  Social History Social History  Substance Use Topics  . Smoking status: Current Some Day Smoker  . Smokeless tobacco: None  . Alcohol Use: 0.0 oz/week    0 Standard drinks or equivalent per week    Review of Systems Constitutional: No fever/chills Eyes: No visual changes. ENT:per hpi Cardiovascular: Denies chest pain. Respiratory: Denies shortness of breath Neurological: Negative for headaches, focal weakness or numbness. 10-point ROS otherwise negative.  ____________________________________________   PHYSICAL EXAM:  VITAL SIGNS: ED Triage Vitals  Enc Vitals Group     BP 12/06/15 1909 121/84 mmHg     Pulse Rate 12/06/15 1909 80     Resp 12/06/15 1909 18     Temp 12/06/15 1909 98.2 F (36.8 C)     Temp Source 12/06/15 1909 Oral     SpO2 12/06/15 1909 98 %     Weight 12/06/15 1909 178 lb (80.74 kg)     Height  12/06/15 1909  (1.575 m)     Head Cir --      Peak Flow --      Pain Score 12/06/15 1910 7     Pain Loc --      Pain Edu? --      Excl. in GC? --     Constitutional: Alert and oriented. Well appearing and in no acute distress. Eyes: Conjunctivae are normal. PERRL. EOMI. Ears:  Mild dullness of the right TM. No erythema. Head: Atraumatic. Nose: No congestion/rhinnorhea. Mouth/Throat: Mucous membranes are moist.  Oropharynx non-erythematous. No lesions. Neck:  Supple.  No adenopathy.   Cardiovascular: Normal rate, regular rhythm. Grossly normal heart sounds.  Good peripheral circulation. Respiratory: Normal respiratory effort.  No retractions. Lungs CTAB. Musculoskeletal: Nml ROM of upper and lower extremity joints. Neurologic:  Normal speech and language. No gross focal neurologic deficits are appreciated. No gait instability. Skin:  Skin is warm, dry and intact. No rash noted. Psychiatric: Mood and affect are normal. Speech and behavior are normal.  ____________________________________________   LABS (all labs ordered are listed, but only abnormal results are displayed)  Labs Reviewed - No data to display ____________________________________________  EKG   ____________________________________________  RADIOLOGY   ____________________________________________   PROCEDURES  Procedure(s) performed: None  Critical Care performed: No  ____________________________________________   INITIAL IMPRESSION / ASSESSMENT AND  PLAN / ED COURSE  Pertinent labs & imaging results that were available during my care of the patient were reviewed by me and considered in my medical decision making (see chart for details).  30 year old female with 1-2 weeks of right ear pain. Symptoms started with URI, sore throat and congestion. She has a history of ear infections. Currently has dullness of the right TM. Concern for underlying Mild infection. Started on amoxicillin.  She will  continue flonase.  Can follow up with ENT if worsening. ____________________________________________   FINAL CLINICAL IMPRESSION(S) / ED DIAGNOSES  Final diagnoses:  Otalgia of right ear      Ignacia BayleyRobert Basel Defalco, PA-C 12/06/15 1934  Myrna Blazeravid Matthew Schaevitz, MD 12/06/15 1949

## 2015-12-06 NOTE — ED Notes (Signed)
Patient c/o right ear pain for one and half weeks. Patient also c/o nasal congestion.

## 2016-08-27 ENCOUNTER — Emergency Department
Admission: EM | Admit: 2016-08-27 | Discharge: 2016-08-27 | Disposition: A | Discharge status: Home / Self Care | Payer: Self-pay | Attending: Emergency Medicine | Admitting: Emergency Medicine

## 2016-08-27 ENCOUNTER — Encounter: Payer: Self-pay | Admitting: Emergency Medicine

## 2016-08-27 DIAGNOSIS — R05 Cough: Secondary | ICD-10-CM | POA: Insufficient documentation

## 2016-08-27 DIAGNOSIS — R509 Fever, unspecified: Secondary | ICD-10-CM | POA: Insufficient documentation

## 2016-08-27 DIAGNOSIS — R0981 Nasal congestion: Secondary | ICD-10-CM | POA: Insufficient documentation

## 2016-08-27 DIAGNOSIS — F17210 Nicotine dependence, cigarettes, uncomplicated: Secondary | ICD-10-CM | POA: Insufficient documentation

## 2016-08-27 DIAGNOSIS — R52 Pain, unspecified: Secondary | ICD-10-CM | POA: Insufficient documentation

## 2016-08-27 DIAGNOSIS — R69 Illness, unspecified: Secondary | ICD-10-CM

## 2016-08-27 DIAGNOSIS — J111 Influenza due to unidentified influenza virus with other respiratory manifestations: Secondary | ICD-10-CM

## 2016-08-27 DIAGNOSIS — R1110 Vomiting, unspecified: Secondary | ICD-10-CM | POA: Insufficient documentation

## 2016-08-27 DIAGNOSIS — Z79899 Other long term (current) drug therapy: Secondary | ICD-10-CM | POA: Insufficient documentation

## 2016-08-27 MED ORDER — IBUPROFEN 400 MG PO TABS
600.0000 mg | ORAL_TABLET | Freq: Once | ORAL | Status: AC
  Administered 2016-08-27: 600 mg via ORAL
  Filled 2016-08-27: qty 2

## 2016-08-27 MED ORDER — SODIUM CHLORIDE 0.9 % IV BOLUS (SEPSIS): 1000.0000 mL | Freq: Once | INTRAVENOUS | Status: DC

## 2016-08-27 NOTE — ED Triage Notes (Addendum)
Pt presents to ED with c/o "flu like symptoms". Onset of symptoms "earlier this week". Pt has congestion, possible fever (unsure due to not having thermometer.), and generalized body aches. Vomited X3 yesterday. Pt alert and calm at this time. No increased work of breathing or distress noted.

## 2016-08-27 NOTE — ED Provider Notes (Signed)
[facility] Emergency Department Provider Note  ____________________________________________  Time seen: Approximately 8:20 PM  I have reviewed the triage vital signs and the nursing notes.   HISTORY  Chief Complaint Generalized Body Aches; Emesis; and Fever   HPI Carolyn Nunez is a 31 y.o. female no significant past medical who presents for flulike symptoms. Patient reports that she has been having congestion, cough that is dry, body aches, subjective fever, and NBNB emesis since the beginning of this week. She has no past medical history. No respiratory distress, no chest pain, no rash, no headache. She reports that at this time she feels markedly improved and has been able to eat and drink today. She denies diarrhea or abdominal pain. She reports that she brought her son here to be evaluated for bilateral ear pain and since she was here she decided to get checked out as well.Marland Kitchen.  History reviewed. No pertinent past medical history.  Patient Active Problem List   Diagnosis Date Noted  . Abdominal pain 09/17/2015    History reviewed. No pertinent surgical history.  Prior to Admission medications   Medication Sig Start Date End Date Taking? Authorizing Provider  amoxicillin (AMOXIL) 500 MG tablet Take 1 tablet (500 mg total) by mouth 3 (three) times daily. 12/06/15   Ignacia Bayleyobert Tumey, PA-C  Omeprazole 20 MG TBEC Take 2 tablets (40 mg total) by mouth 2 (two) times daily. 09/18/15   Delfino LovettVipul Shah, MD    Allergies Patient has no known allergies.  No family history on file.  Social History Social History  Substance Use Topics  . Smoking status: Current Some Day Smoker    Packs/day: 0.50    Types: Cigarettes  . Smokeless tobacco: Never Used  . Alcohol use 0.0 oz/week    Review of Systems  Constitutional: + fever, body aches Eyes: Negative for visual changes. ENT: Negative for sore throat. Neck: No neck pain  Cardiovascular: Negative for chest  pain. Respiratory: Negative for shortness of breath. + cough Gastrointestinal: Negative for abdominal pain,  Diarrhea. + vomiting Genitourinary: Negative for dysuria. Musculoskeletal: Negative for back pain. Skin: Negative for rash. Neurological: Negative for headaches, weakness or numbness. Psych: No SI or HI  ____________________________________________   PHYSICAL EXAM:  VITAL SIGNS: ED Triage Vitals  Enc Vitals Group     BP 08/27/16 1946 (!) 148/98     Pulse Rate 08/27/16 1946 (!) 129     Resp 08/27/16 1946 18     Temp 08/27/16 1946 98.2 F (36.8 C)     Temp Source 08/27/16 1946 Oral     SpO2 08/27/16 1946 99 %     Weight 08/27/16 1947 160 lb (72.6 kg)     Height 08/27/16 1947 5\' 2"  (1.575 m)     Head Circumference --      Peak Flow --      Pain Score 08/27/16 1946 7     Pain Loc --      Pain Edu? --      Excl. in GC? --     Constitutional: Alert and oriented. Well appearing and in no apparent distress. HEENT:      Head: Normocephalic and atraumatic.         Eyes: Conjunctivae are normal. Sclera is non-icteric. EOMI. PERRL      Mouth/Throat: Mucous membranes are moist.       Neck: Supple with no signs of meningismus. Cardiovascular: Tachycardic with regular rhythm. No murmurs, gallops, or rubs. 2+ symmetrical distal  pulses are present in all extremities. No JVD. Respiratory: Normal respiratory effort. Lungs are clear to auscultation bilaterally. No wheezes, crackles, or rhonchi.  Gastrointestinal: Soft, non tender, and non distended with positive bowel sounds. No rebound or guarding. Genitourinary: No CVA tenderness. Musculoskeletal: Nontender with normal range of motion in all extremities. No edema, cyanosis, or erythema of extremities. Neurologic: Normal speech and language. Face is symmetric. Moving all extremities. No gross focal neurologic deficits are appreciated. Skin: Skin is warm, dry and intact. No rash noted. Psychiatric: Mood and affect are normal. Speech  and behavior are normal.  ____________________________________________   LABS (all labs ordered are listed, but only abnormal results are displayed)  Labs Reviewed - No data to display ____________________________________________  EKG  none ____________________________________________  RADIOLOGY  none  ____________________________________________   PROCEDURES  Procedure(s) performed: None Procedures Critical Care performed:  None ____________________________________________   INITIAL IMPRESSION / ASSESSMENT AND PLAN / ED COURSE  31 y.o. female no significant past medical who presents for flulike symptoms for 3 days. Patient feels better at this time. Vitals in the room show tachycardia. Physical exam shows extremely well-appearing female in no distress, clear lungs, moist mucous membranes, soft abdomen. Presentation concerning Influenza. Discussed risks and benefits of tamiflu and patient declined tamiflu. I offered IV fluids, chest x-ray and basic blood work and due to her tachycardia however patient has refused. She says she is only here to make sure that her son gets checked out and she wants to take him home. I explained to the patient that she could be dehydrated or possibly developing sepsis or pneumonia from influenza and that without imaging and blood work we were not able to determine if these things are present. She reports that she feels much better and is no longer vomiting so she will hydrate at home. She is refusing any care at this time other than ibuprofen. We'll discharge her home with close follow-up with PCP recommended that she returns if she feels dizzy, continues to have fever, develops chest pain or shortness of breath.     Pertinent labs & imaging results that were available during my care of the patient were reviewed by me and considered in my medical decision making (see chart for details).    ____________________________________________   FINAL  CLINICAL IMPRESSION(S) / ED DIAGNOSES  Final diagnoses:  Influenza-like illness      NEW MEDICATIONS STARTED DURING THIS VISIT:  New Prescriptions   No medications on file     Note:  This document was prepared using Dragon voice recognition software and may include unintentional dictation errors.    Nita Sickle, MD 08/27/16 [phone]

## 2016-08-27 NOTE — ED Notes (Signed)
Patient refusing testing or fluids at this time. MD to bedside to inform pt that with tachycardia treatment should be considered. Pt continues to verbalize desire to go home after son is treated.

## 2016-08-27 NOTE — Discharge Instructions (Signed)
As I explained to you, your heart rate was really high and I wanted to give you fluids and evaluate with blood work and chest x-ray to make sure you don't have pneumonia or dehydration. If you change your mind and would like for us to take care of you please return at any time. Follow up with her doctor tomorrow. Return to the emergency room if you're feeling worse, chest pain, shortness of breath, fever, or any new symptoms concerning to you.

## 2016-09-07 ENCOUNTER — Emergency Department
Admission: EM | Admit: 2016-09-07 | Discharge: 2016-09-07 | Disposition: A | Discharge status: Home / Self Care | Payer: Self-pay | Attending: Emergency Medicine | Admitting: Emergency Medicine

## 2016-09-07 ENCOUNTER — Encounter: Payer: Self-pay | Admitting: Intensive Care

## 2016-09-07 DIAGNOSIS — F17210 Nicotine dependence, cigarettes, uncomplicated: Secondary | ICD-10-CM | POA: Insufficient documentation

## 2016-09-07 DIAGNOSIS — R0981 Nasal congestion: Secondary | ICD-10-CM

## 2016-09-07 DIAGNOSIS — J069 Acute upper respiratory infection, unspecified: Secondary | ICD-10-CM | POA: Insufficient documentation

## 2016-09-07 MED ORDER — FLUTICASONE PROPIONATE 50 MCG/ACT NA SUSP: 1.0000 | Freq: Every day | NASAL | 0 refills | Status: DC

## 2016-09-07 MED ORDER — PSEUDOEPH-BROMPHEN-DM 30-2-10 MG/5ML PO SYRP: 5.0000 mL | ORAL_SOLUTION | Freq: Three times a day (TID) | ORAL | 0 refills | Status: DC | PRN

## 2016-09-07 NOTE — Discharge Instructions (Signed)
Please take lots of fluids. Take cough medication as prescribed. Use nasal spray as prescribed. Return to the ER for any worsening symptoms urgent changes in her health.

## 2016-09-07 NOTE — ED Provider Notes (Signed)
[facility]-EMERGENCY DEPARTMENT Provider Note   CSN: [id number] Arrival date & time: 09/07/16  2059     History   Chief Complaint Chief Complaint  Patient presents with  . Nasal Congestion    HPI Carolyn Nunez is a 31 y.o. female presents to the emergency department for evaluation of sneezing, cough, congestion. Symptoms began last night. She has taken ibuprofen which has helped with her mild sore throat. She denies any fevers. No chest pain, shortness of breath, abdominal pain, nausea vomiting or diarrhea. Cough is dry and nonproductive. She has significant nasal congestion and the breathing out of her nose.  HPI  History reviewed. No pertinent past medical history.  Patient Active Problem List   Diagnosis Date Noted  . Abdominal pain 09/17/2015    History reviewed. No pertinent surgical history.  OB History    No data available       Home Medications    Prior to Admission medications   Medication Sig Start Date End Date Taking? Authorizing Provider  amoxicillin (AMOXIL) 500 MG tablet Take 1 tablet (500 mg total) by mouth 3 (three) times daily. 12/06/15   Ignacia Bayleyobert Tumey, PA-C  brompheniramine-pseudoephedrine-DM 30-2-10 MG/5ML syrup Take 5 mLs by mouth 3 (three) times daily as needed. 09/07/16   Evon Slackhomas C Klea Nall, PA-C  fluticasone (FLONASE) 50 MCG/ACT nasal spray Place 1 spray into both nostrils daily. 09/07/16 09/07/17  Evon Slackhomas C Charnay Nazario, PA-C  Omeprazole 20 MG TBEC Take 2 tablets (40 mg total) by mouth 2 (two) times daily. 09/18/15   Delfino LovettVipul Shah, MD    Family History History reviewed. No pertinent family history.  Social History Social History  Substance Use Topics  . Smoking status: Current Every Day Smoker    Packs/day: 0.50    Types: Cigarettes  . Smokeless tobacco: Never Used  . Alcohol use 0.0 oz/week     Comment: occ     Allergies   Patient has no known allergies.   Review of Systems Review of Systems  Constitutional: Negative for activity change, chills,  fatigue and fever.  HENT: Positive for congestion and rhinorrhea. Negative for sinus pressure and sore throat.   Eyes: Negative for visual disturbance.  Respiratory: Positive for cough. Negative for chest tightness, shortness of breath and wheezing.   Cardiovascular: Negative for chest pain and leg swelling.  Gastrointestinal: Negative for abdominal pain, diarrhea, nausea and vomiting.  Genitourinary: Negative for dysuria.  Musculoskeletal: Negative for arthralgias and gait problem.  Skin: Negative for rash.  Neurological: Negative for weakness, numbness and headaches.  Hematological: Negative for adenopathy.  Psychiatric/Behavioral: Negative for agitation, behavioral problems and confusion.     Physical Exam Updated Vital Signs BP 129/90 (BP Location: Left Arm)   Pulse 79   Temp 99 F (37.2 C) (Oral)   Resp 17   Ht 5\' 2"  (1.575 m)   Wt 72.6 kg   LMP 08/16/2016   SpO2 100%   BMI 29.26 kg/m   Physical Exam  Constitutional: She appears well-developed and well-nourished. No distress.  HENT:  Head: Normocephalic and atraumatic.  Right Ear: Hearing, tympanic membrane, external ear and ear canal normal.  Left Ear: Hearing, tympanic membrane, external ear and ear canal normal.  Nose: Rhinorrhea present. Right sinus exhibits no maxillary sinus tenderness and no frontal sinus tenderness. Left sinus exhibits no maxillary sinus tenderness and no frontal sinus tenderness.  Mouth/Throat: Oropharynx is clear and moist.  Eyes: Conjunctivae are normal.  Neck: Neck supple.  Cardiovascular: Normal rate and regular rhythm.  No murmur heard. Pulmonary/Chest: Effort normal and breath sounds normal. No respiratory distress. She has no wheezes. She has no rales.  Abdominal: Soft. There is no tenderness. There is no guarding.  Musculoskeletal: She exhibits no edema.  Neurological: She is alert.  Skin: Skin is warm and dry.  Psychiatric: She has a normal mood and affect.  Nursing note and  vitals reviewed.    ED Treatments / Results  Labs (all labs ordered are listed, but only abnormal results are displayed) Labs Reviewed - No data to display  EKG  EKG Interpretation None       Radiology No results found.  Procedures Procedures (including critical care time)  Medications Ordered in ED Medications - No data to display   Initial Impression / Assessment and Plan / ED Course  I have reviewed the triage vital signs and the nursing notes.  Pertinent labs & imaging results that were available during my care of the patient were reviewed by me and considered in my medical decision making (see chart for details).      31 year old female with cough and nasal congestion began less than 24 hours ago.she is placed on Flonase nasal spray, Bromfed cough syrup. She will increase fluids return to the ER for any worsening symptoms urgent changes in her health.  Final Clinical Impressions(s) / ED Diagnoses   Final diagnoses:  Nasal congestion  Viral upper respiratory tract infection    New Prescriptions New Prescriptions   BROMPHENIRAMINE-PSEUDOEPHEDRINE-DM 30-2-10 MG/5ML SYRUP    Take 5 mLs by mouth 3 (three) times daily as needed.   FLUTICASONE (FLONASE) 50 MCG/ACT NASAL SPRAY    Place 1 spray into both nostrils daily.     Evon Slack, PA-C 09/07/16 2141    Minna Antis, MD 09/07/16 2351

## 2016-09-07 NOTE — ED Triage Notes (Signed)
PAtient presents today with c/o nasal congestion, cough, and aches all over. Ambulatory in triage with NAD noted. Denies chest pain or SOB.

## 2016-09-07 NOTE — ED Notes (Signed)

## 2016-12-04 ENCOUNTER — Emergency Department
Admission: EM | Admit: 2016-12-04 | Discharge: 2016-12-04 | Disposition: A | Discharge status: Home / Self Care | Payer: Self-pay | Attending: Emergency Medicine | Admitting: Emergency Medicine

## 2016-12-04 ENCOUNTER — Encounter: Payer: Self-pay | Admitting: Emergency Medicine

## 2016-12-04 DIAGNOSIS — F17210 Nicotine dependence, cigarettes, uncomplicated: Secondary | ICD-10-CM | POA: Insufficient documentation

## 2016-12-04 DIAGNOSIS — M720 Palmar fascial fibromatosis [Dupuytren]: Secondary | ICD-10-CM | POA: Insufficient documentation

## 2016-12-04 MED ORDER — MELOXICAM 7.5 MG PO TABS: 7.5000 mg | ORAL_TABLET | Freq: Every day | ORAL | 1 refills | Status: AC

## 2016-12-04 NOTE — ED Provider Notes (Signed)
[facility] Emergency Department Provider Note  ____________________________________________  Time seen: Approximately 3:19 PM  I have reviewed the triage vital signs and the nursing notes.   HISTORY  Chief Complaint Hand Pain    HPI Carolyn Nunez is a 31 y.o. female presenting to the emergency department with chronic 8 out of 10 throbbing and aching right hand pain for the past 1-1/2 years. Patient states that all 5 of her right fingers are drawn into flexion at baseline. Patient denies radiculopathy or weakness. No skin compromise. Patient's symptoms are not reproduced with range of motion at the neck.. Patient denies falls or traumas. Patient has tried naproxen and Tylenol, which have not relieved her symptoms.   History reviewed. No pertinent past medical history.  Patient Active Problem List   Diagnosis Date Noted  . Abdominal pain 09/17/2015    History reviewed. No pertinent surgical history.  Prior to Admission medications   Medication Sig Start Date End Date Taking? Authorizing Provider  amoxicillin (AMOXIL) 500 MG tablet Take 1 tablet (500 mg total) by mouth 3 (three) times daily. 12/06/15   Ignacia Bayley, PA-C  brompheniramine-pseudoephedrine-DM 30-2-10 MG/5ML syrup Take 5 mLs by mouth 3 (three) times daily as needed. 09/07/16   Evon Slack, PA-C  fluticasone (FLONASE) 50 MCG/ACT nasal spray Place 1 spray into both nostrils daily. 09/07/16 09/07/17  Evon Slack, PA-C  meloxicam (MOBIC) 7.5 MG tablet Take 1 tablet (7.5 mg total) by mouth daily. 12/04/16 12/11/16  Orvil Feil, PA-C  Omeprazole 20 MG TBEC Take 2 tablets (40 mg total) by mouth 2 (two) times daily. 09/18/15   Delfino Lovett, MD    Allergies Patient has no known allergies.  No family history on file.  Social History Social History  Substance Use Topics  . Smoking status: Current Every Day Smoker    Packs/day: 0.50    Types: Cigarettes  . Smokeless tobacco: Never Used   . Alcohol use 0.0 oz/week     Comment: occ     Review of Systems  Constitutional: No fever/chills Cardiovascular: no chest pain. Respiratory: no cough. No SOB. Musculoskeletal: Patient has right hand pain Skin: Negative for rash, abrasions, lacerations, ecchymosis. Neurological: Negative for headaches, focal weakness or numbness.   ____________________________________________   PHYSICAL EXAM:  VITAL SIGNS: ED Triage Vitals [12/04/16 1341]  Enc Vitals Group     BP 129/85     Pulse Rate 76     Resp 16     Temp 98.4 F (36.9 C)     Temp Source Oral     SpO2 99 %     Weight 160 lb (72.6 kg)     Height 5\' 2"  (1.575 m)     Head Circumference      Peak Flow      Pain Score 8     Pain Loc      Pain Edu?      Excl. in GC?      Constitutional: Alert and oriented. Well appearing and in no acute distress. Eyes: Conjunctivae are normal. PERRL. EOMI. Head: Atraumatic. Neck: Full range of motion. Symptoms are not reproduced with range of motion at the neck. Cardiovascular: Normal rate, regular rhythm. Normal S1 and S2.  Good peripheral circulation. Respiratory: Normal respiratory effort without tachypnea or retractions. Lungs CTAB. Good air entry to the bases with no decreased or absent breath sounds. Musculoskeletal: Patient has 5 out of 5 strength in the upper extremities bilaterally. Patient is able to  perform full range of motion at the right wrist, right elbow and right shoulder. Patient is able to move all 5 right fingers. Patient's right fingers are drawn into flexion with palpable cords along the volar aspect of the hand. Palpable radial and ulnar pulses bilaterally and symmetrically. Neurologic:  Normal speech and language. No gross focal neurologic deficits are appreciated. Reflexes are 2+ and symmetric in the lower extremities bilaterally. Skin:  Skin is warm, dry and intact. No rash noted. Psychiatric: Mood and affect are normal. Speech and behavior are normal. Patient  exhibits appropriate insight and judgement.   ____________________________________________   LABS (all labs ordered are listed, but only abnormal results are displayed)  Labs Reviewed - No data to display ____________________________________________  EKG   ____________________________________________  RADIOLOGY  No results found.  ____________________________________________    PROCEDURES  Procedure(s) performed:    Procedures    Medications - No data to display   ____________________________________________   INITIAL IMPRESSION / ASSESSMENT AND PLAN / ED COURSE  Pertinent labs & imaging results that were available during my care of the patient were reviewed by me and considered in my medical decision making (see chart for details).  Review of the [place] CSRS was performed in accordance of the [facility] prior to dispensing any controlled drugs.     Assessment and plan: Dupuytren's contracture Patient presents to the emergency department with right hand pain for the past 1-1/2 years. On physical exam, right fingers are drawn into flexion with palpable cords consistent with Dupuytren's contracture. A referral was made to orthopedics, Dr. Ernest PineHooten. Patient was discharged with Mobic. All patient questions were answered.     ____________________________________________  FINAL CLINICAL IMPRESSION(S) / ED DIAGNOSES  Final diagnoses:  Dupuytren contracture      NEW MEDICATIONS STARTED DURING THIS VISIT:  Discharge Medication List as of 12/04/2016  3:30 PM    START taking these medications   Details  meloxicam (MOBIC) 7.5 MG tablet Take 1 tablet (7.5 mg total) by mouth daily., Starting Fri 12/04/2016, Until Fri 12/11/2016, Print            This chart was dictated using voice recognition software/Dragon. Despite best efforts to proofread, errors can occur which can change the meaning. Any change was purely unintentional.    Orvil FeilWoods, Paeton Studer M, PA-C 12/04/16  1528    Pia MauWoods, Khloi Rawl NondaltonM, PA-C 12/04/16 1544    Minna AntisPaduchowski, Kevin, MD 12/04/16 2337

## 2016-12-04 NOTE — ED Triage Notes (Signed)
Pt c/o right hand pain for the past 1.5 years, pt states that pain is worse over the last 2 weeks. Pt in NAD at this time.

## 2016-12-15 ENCOUNTER — Encounter: Payer: Self-pay | Admitting: Emergency Medicine

## 2016-12-15 ENCOUNTER — Emergency Department
Admission: EM | Admit: 2016-12-15 | Discharge: 2016-12-15 | Disposition: A | Discharge status: Home / Self Care | Payer: Self-pay | Attending: Emergency Medicine | Admitting: Emergency Medicine

## 2016-12-15 DIAGNOSIS — M7918 Myalgia, other site: Secondary | ICD-10-CM

## 2016-12-15 DIAGNOSIS — F17210 Nicotine dependence, cigarettes, uncomplicated: Secondary | ICD-10-CM | POA: Insufficient documentation

## 2016-12-15 DIAGNOSIS — Y999 Unspecified external cause status: Secondary | ICD-10-CM | POA: Insufficient documentation

## 2016-12-15 DIAGNOSIS — Y929 Unspecified place or not applicable: Secondary | ICD-10-CM | POA: Insufficient documentation

## 2016-12-15 DIAGNOSIS — Z79899 Other long term (current) drug therapy: Secondary | ICD-10-CM | POA: Insufficient documentation

## 2016-12-15 DIAGNOSIS — S40812A Abrasion of left upper arm, initial encounter: Secondary | ICD-10-CM | POA: Insufficient documentation

## 2016-12-15 DIAGNOSIS — S1081XA Abrasion of other specified part of neck, initial encounter: Secondary | ICD-10-CM | POA: Insufficient documentation

## 2016-12-15 DIAGNOSIS — Y939 Activity, unspecified: Secondary | ICD-10-CM | POA: Insufficient documentation

## 2016-12-15 MED ORDER — IBUPROFEN 600 MG PO TABS
600.0000 mg | ORAL_TABLET | Freq: Once | ORAL | Status: AC
  Administered 2016-12-15: 600 mg via ORAL
  Filled 2016-12-15: qty 1
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
## 2016-12-15 NOTE — Discharge Instructions (Signed)
Please take tylenol or ibuprofen for your pain and please follow up with the acute care clinic
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
## 2017-04-26 ENCOUNTER — Encounter: Payer: Self-pay | Admitting: Emergency Medicine

## 2017-04-26 ENCOUNTER — Emergency Department: Payer: Self-pay

## 2017-04-26 DIAGNOSIS — R05 Cough: Secondary | ICD-10-CM | POA: Insufficient documentation

## 2017-04-26 DIAGNOSIS — Z5321 Procedure and treatment not carried out due to patient leaving prior to being seen by health care provider: Secondary | ICD-10-CM | POA: Insufficient documentation

## 2017-04-26 NOTE — ED Triage Notes (Signed)
Patient ambulatory to triage with steady gait, without difficulty or distress noted, mask in place; pt reports sinus congestion and prod cough green sputum since yesterday with fever and nausea

## 2017-04-26 NOTE — ED Notes (Signed)
No answer when called several times from lobby for xray

## 2017-04-27 ENCOUNTER — Emergency Department
Admission: EM | Admit: 2017-04-27 | Discharge: 2017-04-27 | Disposition: A | Discharge status: Home / Self Care | Payer: Self-pay | Attending: Emergency Medicine | Admitting: Emergency Medicine

## 2017-04-27 NOTE — ED Notes (Signed)
Pt called to room, no answer  

## 2017-12-29 ENCOUNTER — Other Ambulatory Visit: Payer: Self-pay

## 2017-12-29 ENCOUNTER — Emergency Department: Payer: Self-pay

## 2017-12-29 ENCOUNTER — Encounter: Payer: Self-pay | Admitting: *Deleted

## 2017-12-29 ENCOUNTER — Emergency Department
Admission: EM | Admit: 2017-12-29 | Discharge: 2017-12-30 | Disposition: A | Discharge status: Home / Self Care | Payer: Self-pay | Attending: Emergency Medicine | Admitting: Emergency Medicine

## 2017-12-29 DIAGNOSIS — F17210 Nicotine dependence, cigarettes, uncomplicated: Secondary | ICD-10-CM | POA: Insufficient documentation

## 2017-12-29 DIAGNOSIS — N83201 Unspecified ovarian cyst, right side: Secondary | ICD-10-CM | POA: Insufficient documentation

## 2017-12-29 DIAGNOSIS — Z79899 Other long term (current) drug therapy: Secondary | ICD-10-CM | POA: Insufficient documentation

## 2017-12-29 DIAGNOSIS — R1031 Right lower quadrant pain: Secondary | ICD-10-CM

## 2017-12-29 LAB — CBC
HEMATOCRIT: 37.7 % (ref 35.0–47.0)
HEMOGLOBIN: 12.6 g/dL (ref 12.0–16.0)
MCH: 27.4 pg (ref 26.0–34.0)
MCHC: 33.3 g/dL (ref 32.0–36.0)
MCV: 82.2 fL (ref 80.0–100.0)
Platelets: 258 10*3/uL (ref 150–440)
RBC: 4.58 MIL/uL (ref 3.80–5.20)
RDW: 15 % — AB (ref 11.5–14.5)
WBC: 8.8 10*3/uL (ref 3.6–11.0)

## 2017-12-29 LAB — COMPREHENSIVE METABOLIC PANEL
ALT: 16 U/L (ref 14–54)
ANION GAP: 10 (ref 5–15)
AST: 24 U/L (ref 15–41)
Albumin: 3.8 g/dL (ref 3.5–5.0)
Alkaline Phosphatase: 61 U/L (ref 38–126)
BILIRUBIN TOTAL: 0.4 mg/dL (ref 0.3–1.2)
BUN: 12 mg/dL (ref 6–20)
CHLORIDE: 107 mmol/L (ref 101–111)
CO2: 22 mmol/L (ref 22–32)
Calcium: 8.9 mg/dL (ref 8.9–10.3)
Creatinine, Ser: 1.09 mg/dL — ABNORMAL HIGH (ref 0.44–1.00)
Glucose, Bld: 121 mg/dL — ABNORMAL HIGH (ref 65–99)
POTASSIUM: 3.7 mmol/L (ref 3.5–5.1)
Sodium: 139 mmol/L (ref 135–145)
Total Protein: 7.5 g/dL (ref 6.5–8.1)

## 2017-12-29 LAB — URINALYSIS, COMPLETE (UACMP) WITH MICROSCOPIC
BILIRUBIN URINE: NEGATIVE
Glucose, UA: NEGATIVE mg/dL
KETONES UR: NEGATIVE mg/dL
Nitrite: NEGATIVE
PH: 6 (ref 5.0–8.0)
Protein, ur: NEGATIVE mg/dL
SPECIFIC GRAVITY, URINE: 1.015 (ref 1.005–1.030)

## 2017-12-29 LAB — LIPASE, BLOOD: LIPASE: 27 U/L (ref 11–51)

## 2017-12-29 LAB — POC URINE PREG, ED: PREG TEST UR: NEGATIVE

## 2017-12-29 NOTE — ED Provider Notes (Signed)
[facility] Emergency Department Provider Note    First MD Initiated Contact with Patient 12/29/17 2304     (approximate)  I have reviewed the triage vital signs and the nursing notes.   HISTORY  Chief Complaint Abdominal Pain   HPI Carolyn Nunez is a 32 y.o. female presents to the emergency department with a 1 day history of right pelvic pain that is intermittent in nature.  Patient describes the pain as throbbing and of considerable intensity when it does occur.  At present patient has no pain.  Patient denies any urinary frequency urgency or dysuria.  Patient denies any diarrhea or constipation.  Patient does however admit to one episode of vomiting this a.m.  Patient denies any fever afebrile on presentation.  Past medical history None  Patient Active Problem List   Diagnosis Date Noted  . Abdominal pain 09/17/2015    Past surgical history None  Prior to Admission medications   Medication Sig Start Date End Date Taking? Authorizing Provider  amoxicillin (AMOXIL) 500 MG tablet Take 1 tablet (500 mg total) by mouth 3 (three) times daily. 12/06/15   Ignacia Bayley, PA-C  brompheniramine-pseudoephedrine-DM 30-2-10 MG/5ML syrup Take 5 mLs by mouth 3 (three) times daily as needed. 09/07/16   Evon Slack, PA-C  fluticasone (FLONASE) 50 MCG/ACT nasal spray Place 1 spray into both nostrils daily. 09/07/16 09/07/17  Evon Slack, PA-C  Omeprazole 20 MG TBEC Take 2 tablets (40 mg total) by mouth 2 (two) times daily. 09/18/15   Delfino Lovett, MD  traMADol (ULTRAM) 50 MG tablet Take 1 tablet (50 mg total) by mouth every 6 (six) hours as needed. 12/30/17 12/30/18  Darci Current, MD    Allergies No known drug allergies History reviewed. No pertinent family history.  Social History Social History   Tobacco Use  . Smoking status: Current Every Day Smoker    Packs/day: 0.50    Types: Cigarettes  . Smokeless tobacco: Never Used  Substance Use Topics    . Alcohol use: Yes    Alcohol/week: 0.0 oz    Comment: occ  . Drug use: No    Review of Systems Constitutional: No fever/chills Eyes: No visual changes. ENT: No sore throat. Cardiovascular: Denies chest pain. Respiratory: Denies shortness of breath. Gastrointestinal: No abdominal pain.  No nausea, no vomiting.  No diarrhea.  No constipation. Genitourinary: Negative for dysuria.  Positive for pelvic pain Musculoskeletal: Negative for neck pain.  Negative for back pain. Integumentary: Negative for rash. Neurological: Negative for headaches, focal weakness or numbness.   ____________________________________________   PHYSICAL EXAM:  VITAL SIGNS: ED Triage Vitals  Enc Vitals Group     BP 12/29/17 2152 126/86     Pulse Rate 12/29/17 2152 75     Resp 12/29/17 2152 16     Temp 12/29/17 2153 98.9 F (37.2 C)     Temp Source 12/29/17 2153 Oral     SpO2 12/29/17 2152 100 %     Weight 12/29/17 2154 85.7 kg (189 lb)     Height 12/29/17 2154 1.575 m (5\' 2" )     Head Circumference --      Peak Flow --      Pain Score 12/29/17 2153 5     Pain Loc --      Pain Edu? --      Excl. in GC? --     Constitutional: Alert and oriented. Well appearing and in no acute distress. Eyes: Conjunctivae are  normal.  Head: Atraumatic. Mouth/Throat: Mucous membranes are moist.  Oropharynx non-erythematous Neck: No stridor.  Cardiovascular: Normal rate, regular rhythm. Good peripheral circulation. Grossly normal heart sounds. Respiratory: Normal respiratory effort.  No retractions. Lungs CTAB. Gastrointestinal: Soft and nontender. No distention.   Musculoskeletal: No lower extremity tenderness nor edema. No gross deformities of extremities. Neurologic:  Normal speech and language. No gross focal neurologic deficits are appreciated.  Skin:  Skin is warm, dry and intact. No rash noted. Psychiatric: Mood and affect are normal. Speech and behavior are  normal.  ____________________________________________   LABS (all labs ordered are listed, but only abnormal results are displayed)  Labs Reviewed  COMPREHENSIVE METABOLIC PANEL - Abnormal; Notable for the following components:      Result Value   Glucose, Bld 121 (*)    Creatinine, Ser 1.09 (*)    All other components within normal limits  CBC - Abnormal; Notable for the following components:   RDW 15.0 (*)    All other components within normal limits  URINALYSIS, COMPLETE (UACMP) WITH MICROSCOPIC - Abnormal; Notable for the following components:   Color, Urine YELLOW (*)    APPearance CLEAR (*)    Hgb urine dipstick SMALL (*)    Leukocytes, UA TRACE (*)    Bacteria, UA FEW (*)    All other components within normal limits  LIPASE, BLOOD  POC URINE PREG, ED     RADIOLOGY I, Hatley N Darold Miley, personally viewed and evaluated these images (plain radiographs) as part of my medical decision making, as well as reviewing the written report by the radiologist.  ED MD interpretation: Right ovarian cysts noted per radiologist  Official radiology report(s): US Pelvis Transvanginal Non-ob (tv Only)  Result Date: 12/30/2017 CLINICAL DATA:  Initial evaluation for right pelvic pain for 2 days. EXAM: TRANSABDOMINAL AND TRANSVAGINAL ULTRASOUND OF PELVIS TECHNIQUE: Both transabdominal and transvaginal ultrasound examinations of the pelvis were performed. Transabdominal technique was performed for global imaging of the pelvis including uterus, ovaries, adnexal regions, and pelvic cul-de-sac. It was necessary to proceed with endovaginal exam following the transabdominal exam to visualize the uterus, endometrium, and ovaries. COMPARISON:  Prior CT from 09/09/2015. FINDINGS: Uterus Measurements: 7.7 x 4.7 x 6.3 cm. No fibroids or other mass visualized. Endometrium Thickness: 12 mm.  No focal abnormality visualized. Right ovary Measurements: 5.4 x 2.5 x 2.0 cm. 1.9 x 1.8 x 1.6 cm corpus luteal cyst  noted. Left ovary Measurements: 4.6 x 2.2 x 1.7 cm. Normal appearance/no adnexal mass. Other findings Small volume free physiologic fluid within the pelvis. IMPRESSION: 1. 1.9 cm right ovarian corpus corpus luteal cyst with associated small volume free physiologic pelvic fluid. 2. No other acute abnormality within the pelvis. Electronically Signed   By: Rise Mu M.D.   On: 12/30/2017 00:18   US Pelvis Complete  Result Date: 12/30/2017 CLINICAL DATA:  Initial evaluation for right pelvic pain for 2 days. EXAM: TRANSABDOMINAL AND TRANSVAGINAL ULTRASOUND OF PELVIS TECHNIQUE: Both transabdominal and transvaginal ultrasound examinations of the pelvis were performed. Transabdominal technique was performed for global imaging of the pelvis including uterus, ovaries, adnexal regions, and pelvic cul-de-sac. It was necessary to proceed with endovaginal exam following the transabdominal exam to visualize the uterus, endometrium, and ovaries. COMPARISON:  Prior CT from 09/09/2015. FINDINGS: Uterus Measurements: 7.7 x 4.7 x 6.3 cm. No fibroids or other mass visualized. Endometrium Thickness: 12 mm.  No focal abnormality visualized. Right ovary Measurements: 5.4 x 2.5 x 2.0 cm. 1.9 x 1.8 x 1.6  cm corpus luteal cyst noted. Left ovary Measurements: 4.6 x 2.2 x 1.7 cm. Normal appearance/no adnexal mass. Other findings Small volume free physiologic fluid within the pelvis. IMPRESSION: 1. 1.9 cm right ovarian corpus corpus luteal cyst with associated small volume free physiologic pelvic fluid. 2. No other acute abnormality within the pelvis. Electronically Signed   By: Rise MuBenjamin  McClintock M.D.   On: 12/30/2017 00:18     Procedures   ____________________________________________   INITIAL IMPRESSION / ASSESSMENT AND PLAN / ED COURSE  As part of my medical decision making, I reviewed the following data within the electronic MEDICAL RECORD NUMBER  32 year old female presenting with above-stated history and physical  exam secondary to right lower quadrant/pelvic abdominal pain.  History more consistent with a ovarian cyst and as such an ultrasound was performed which revealed a right ovarian cysts.  Consider the possibility of appendicitis however no pain with palpation. ____________________________________________  FINAL CLINICAL IMPRESSION(S) / ED DIAGNOSES  Final diagnoses:  Right ovarian cyst     MEDICATIONS GIVEN DURING THIS VISIT:  Medications - No data to display   ED Discharge Orders        Ordered    traMADol (ULTRAM) 50 MG tablet  Every 6 hours PRN     12/30/17 0042       Note:  This document was prepared using Dragon voice recognition software and may include unintentional dictation errors.     Darci CurrentBrown, [place] N, MD 12/30/17 [phone]

## 2017-12-29 NOTE — ED Notes (Addendum)
Pt reports having intermittent RLQ pain since yesterday. Last period was two weeks ago. Dr. Manson PasseyBrown at the bedside. Pt denies pain at this time.
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
## 2017-12-30 MED ORDER — TRAMADOL HCL 50 MG PO TABS: 50.0000 mg | ORAL_TABLET | Freq: Four times a day (QID) | ORAL | 0 refills | Status: DC | PRN

## 2018-03-09 ENCOUNTER — Emergency Department
Admission: EM | Admit: 2018-03-09 | Discharge: 2018-03-09 | Disposition: A | Discharge status: Home / Self Care | Payer: Self-pay | Attending: Emergency Medicine | Admitting: Emergency Medicine

## 2018-03-09 ENCOUNTER — Emergency Department: Payer: Self-pay

## 2018-03-09 ENCOUNTER — Encounter: Payer: Self-pay | Admitting: Emergency Medicine

## 2018-03-09 DIAGNOSIS — J069 Acute upper respiratory infection, unspecified: Secondary | ICD-10-CM | POA: Insufficient documentation

## 2018-03-09 DIAGNOSIS — J209 Acute bronchitis, unspecified: Secondary | ICD-10-CM | POA: Insufficient documentation

## 2018-03-09 DIAGNOSIS — F17210 Nicotine dependence, cigarettes, uncomplicated: Secondary | ICD-10-CM | POA: Insufficient documentation

## 2018-03-09 MED ORDER — ALBUTEROL SULFATE HFA 108 (90 BASE) MCG/ACT IN AERS: 2.0000 | INHALATION_SPRAY | RESPIRATORY_TRACT | 0 refills | Status: DC | PRN

## 2018-03-09 MED ORDER — PSEUDOEPH-BROMPHEN-DM 30-2-10 MG/5ML PO SYRP: 10.0000 mL | ORAL_SOLUTION | Freq: Four times a day (QID) | ORAL | 0 refills | Status: DC | PRN

## 2018-03-09 MED ORDER — FLUTICASONE PROPIONATE 50 MCG/ACT NA SUSP: 1.0000 | Freq: Two times a day (BID) | NASAL | 0 refills | Status: DC

## 2018-03-09 MED ORDER — ACETAMINOPHEN 500 MG PO TABS
1000.0000 mg | ORAL_TABLET | Freq: Once | ORAL | Status: AC
  Administered 2018-03-09: 1000 mg via ORAL
  Filled 2018-03-09: qty 2

## 2018-03-09 MED ORDER — CETIRIZINE HCL 10 MG PO TABS: 10.0000 mg | ORAL_TABLET | Freq: Every day | ORAL | 0 refills | Status: DC

## 2018-03-09 MED ORDER — PREDNISONE 50 MG PO TABS: 50.0000 mg | ORAL_TABLET | Freq: Every day | ORAL | 0 refills | Status: DC

## 2018-03-09 MED ORDER — IPRATROPIUM-ALBUTEROL 0.5-2.5 (3) MG/3ML IN SOLN
3.0000 mL | Freq: Once | RESPIRATORY_TRACT | Status: AC
  Administered 2018-03-09: 3 mL via RESPIRATORY_TRACT
  Filled 2018-03-09: qty 3

## 2018-03-09 NOTE — ED Provider Notes (Signed)
[facility] Emergency Department Provider Note  ____________________________________________  Time seen: Approximately 7:46 PM  I have reviewed the triage vital signs and the nursing notes.   HISTORY  Chief Complaint Cough    HPI Carolyn Nunez is a 32 y.o. female who presents the emergency department complaining of malaise, nasal congestion, sore throat, cough.  Patient reports that symptoms have been going on for 2 to 3 days.  Patient is taken Tylenol today but no other medications for this complaint.  She denies any headache, neck pain or stiffness, chest pain, shortness of breath, abdominal pain, nausea vomiting, diarrhea or constipation.  No significant past medical history.  No other complaints at this time.    History reviewed. No pertinent past medical history.  Patient Active Problem List   Diagnosis Date Noted  . Abdominal pain 09/17/2015    History reviewed. No pertinent surgical history.  Prior to Admission medications   Medication Sig Start Date End Date Taking? Authorizing Provider  albuterol (PROVENTIL HFA;VENTOLIN HFA) 108 (90 Base) MCG/ACT inhaler Inhale 2 puffs into the lungs every 4 (four) hours as needed for wheezing or shortness of breath. 03/09/18   Cuthriell, Delorise RoyalsJonathan D, PA-C  amoxicillin (AMOXIL) 500 MG tablet Take 1 tablet (500 mg total) by mouth 3 (three) times daily. 12/06/15   Ignacia Bayleyumey, Robert, PA-C  brompheniramine-pseudoephedrine-DM 30-2-10 MG/5ML syrup Take 10 mLs by mouth 4 (four) times daily as needed. 03/09/18   Cuthriell, Delorise RoyalsJonathan D, PA-C  cetirizine (ZYRTEC) 10 MG tablet Take 1 tablet (10 mg total) by mouth daily. 03/09/18   Cuthriell, Delorise RoyalsJonathan D, PA-C  fluticasone (FLONASE) 50 MCG/ACT nasal spray Place 1 spray into both nostrils 2 (two) times daily. 03/09/18   Cuthriell, Delorise RoyalsJonathan D, PA-C  Omeprazole 20 MG TBEC Take 2 tablets (40 mg total) by mouth 2 (two) times daily. 09/18/15   Delfino LovettShah, Vipul, MD  predniSONE (DELTASONE) 50 MG  tablet Take 1 tablet (50 mg total) by mouth daily with breakfast. 03/09/18   Cuthriell, Delorise RoyalsJonathan D, PA-C  traMADol (ULTRAM) 50 MG tablet Take 1 tablet (50 mg total) by mouth every 6 (six) hours as needed. 12/30/17 12/30/18  Darci CurrentBrown, [place] N, MD    Allergies Patient has no known allergies.  History reviewed. No pertinent family history.  Social History Social History   Tobacco Use  . Smoking status: Current Every Day Smoker    Packs/day: 0.50    Types: Cigarettes  . Smokeless tobacco: Never Used  Substance Use Topics  . Alcohol use: Yes    Alcohol/week: 0.0 standard drinks    Comment: occ  . Drug use: No     Review of Systems  Constitutional: No fever/chills Eyes: No visual changes. No discharge ENT: Positive for nasal congestion, sore throat. Cardiovascular: no chest pain. Respiratory: Positive cough. No SOB. Gastrointestinal: No abdominal pain.  No nausea, no vomiting.  No diarrhea.  No constipation. Genitourinary: Negative for dysuria. No hematuria Musculoskeletal: Negative for musculoskeletal pain. Skin: Negative for rash, abrasions, lacerations, ecchymosis. Neurological: Negative for headaches, focal weakness or numbness. 10-point ROS otherwise negative.  ____________________________________________   PHYSICAL EXAM:  VITAL SIGNS: ED Triage Vitals [03/09/18 1908]  Enc Vitals Group     BP 101/74     Pulse Rate 100     Resp 18     Temp (!) 100.7 F (38.2 C)     Temp Source Oral     SpO2 99 %     Weight 188 lb 15 oz (85.7 kg)  Height      Head Circumference      Peak Flow      Pain Score      Pain Loc      Pain Edu?      Excl. in GC?      Constitutional: Alert and oriented. Well appearing and in no acute distress. Eyes: Conjunctivae are normal. PERRL. EOMI. Head: Atraumatic. ENT:      Ears: EACs and TMs unremarkable bilaterally.      Nose: Moderate clear congestion/rhinnorhea.  Tenderness to percussion over the sinuses.      Mouth/Throat: Mucous  membranes are moist.  Pharynx is not erythematous and nonedematous. Neck: No stridor.   Hematological/Lymphatic/Immunilogical: No cervical lymphadenopathy. Cardiovascular: Normal rate, regular rhythm. Normal S1 and S2.  Good peripheral circulation. Respiratory: Normal respiratory effort without tachypnea or retractions.  Patient had received DuoNeb prior to my auscultation, currently lungs CTAB. Good air entry to the bases with no decreased or absent breath sounds. Gastrointestinal: Bowel sounds 4 quadrants. Soft and nontender to palpation. No guarding or rigidity. No palpable masses. No distention. No CVA tenderness. Musculoskeletal: Full range of motion to all extremities. No gross deformities appreciated. Neurologic:  Normal speech and language. No gross focal neurologic deficits are appreciated.  Skin:  Skin is warm, dry and intact. No rash noted. Psychiatric: Mood and affect are normal. Speech and behavior are normal. Patient exhibits appropriate insight and judgement.   ____________________________________________   LABS (all labs ordered are listed, but only abnormal results are displayed)  Labs Reviewed - No data to display ____________________________________________  EKG   ____________________________________________  RADIOLOGY I personally viewed and evaluated these images as part of my medical decision making, as well as reviewing the written report by the radiologist.  Dg Chest 2 View  Result Date: 03/09/2018 CLINICAL DATA:  Cough and nasal congestion EXAM: CHEST - 2 VIEW COMPARISON:  02/28/2007 FINDINGS: The heart size and mediastinal contours are within normal limits. Both lungs are clear. The visualized skeletal structures are unremarkable. IMPRESSION: No active cardiopulmonary disease. Electronically Signed   By: Jasmine PangKim  Fujinaga M.D.   On: 03/09/2018 19:30    ____________________________________________    PROCEDURES  Procedure(s) performed:     Procedures    Medications  acetaminophen (TYLENOL) tablet 1,000 mg (1,000 mg Oral Given 03/09/18 1910)  ipratropium-albuterol (DUONEB) 0.5-2.5 (3) MG/3ML nebulizer solution 3 mL (3 mLs Nebulization Given 03/09/18 1911)     ____________________________________________   INITIAL IMPRESSION / ASSESSMENT AND PLAN / ED COURSE  Pertinent labs & imaging results that were available during my care of the patient were reviewed by me and considered in my medical decision making (see chart for details).  Review of the [place] CSRS was performed in accordance of the [facility] prior to dispensing any controlled drugs.      Patient's diagnosis is consistent with bronchitis.  Patient presents the emergency department with URI symptoms x3 days.  Chest x-ray reveals no acute consolidation concerning for pneumonia.  Differential included viral URI, sinusitis, strep, pharyngitis, bronchitis, pneumonia.  Patient symptoms are consistent with viral upper respiratory illness and viral bronchitis.. Patient will be discharged home with prescriptions for Flonase, Zyrtec, Bromfed cough syrup, prednisone, albuterol. Patient is to follow up with primary care as needed or otherwise directed. Patient is given ED precautions to return to the ED for any worsening or new symptoms.     ____________________________________________  FINAL CLINICAL IMPRESSION(S) / ED DIAGNOSES  Final diagnoses:  Viral upper respiratory tract infection  Acute bronchitis, unspecified organism      NEW MEDICATIONS STARTED DURING THIS VISIT:  ED Discharge Orders         Ordered    fluticasone (FLONASE) 50 MCG/ACT nasal spray  2 times daily     03/09/18 1957    cetirizine (ZYRTEC) 10 MG tablet  Daily     03/09/18 1957    brompheniramine-pseudoephedrine-DM 30-2-10 MG/5ML syrup  4 times daily PRN     03/09/18 1957    albuterol (PROVENTIL HFA;VENTOLIN HFA) 108 (90 Base) MCG/ACT inhaler  Every 4 hours PRN     03/09/18 1957    predniSONE  (DELTASONE) 50 MG tablet  Daily with breakfast     03/09/18 1957              This chart was dictated using voice recognition software/Dragon. Despite best efforts to proofread, errors can occur which can change the meaning. Any change was purely unintentional.    Racheal Patches, PA-C 03/09/18 Adriana Mccallum, [place], MD 03/12/18 [phone]

## 2018-03-09 NOTE — ED Triage Notes (Signed)
Pt c/o cough, nasal congestion and fever x3 days. Pt last took tylenol at 1400 today.

## 2018-03-09 NOTE — ED Notes (Signed)
Has had fever the last couple of days and just not felt good

## 2018-04-25 ENCOUNTER — Emergency Department: Payer: Self-pay

## 2018-04-25 ENCOUNTER — Emergency Department
Admission: EM | Admit: 2018-04-25 | Discharge: 2018-04-25 | Disposition: A | Discharge status: Home / Self Care | Payer: Self-pay | Attending: Student in an Organized Health Care Education/Training Program | Admitting: Student in an Organized Health Care Education/Training Program

## 2018-04-25 ENCOUNTER — Other Ambulatory Visit: Payer: Self-pay

## 2018-04-25 DIAGNOSIS — Z79899 Other long term (current) drug therapy: Secondary | ICD-10-CM | POA: Insufficient documentation

## 2018-04-25 DIAGNOSIS — R1013 Epigastric pain: Secondary | ICD-10-CM | POA: Insufficient documentation

## 2018-04-25 DIAGNOSIS — R112 Nausea with vomiting, unspecified: Secondary | ICD-10-CM | POA: Insufficient documentation

## 2018-04-25 DIAGNOSIS — F17210 Nicotine dependence, cigarettes, uncomplicated: Secondary | ICD-10-CM | POA: Insufficient documentation

## 2018-04-25 LAB — COMPREHENSIVE METABOLIC PANEL
ALT: 64 U/L — ABNORMAL HIGH (ref 0–44)
ANION GAP: 9 (ref 5–15)
AST: 85 U/L — ABNORMAL HIGH (ref 15–41)
Albumin: 4.3 g/dL (ref 3.5–5.0)
Alkaline Phosphatase: 66 U/L (ref 38–126)
BILIRUBIN TOTAL: 1.1 mg/dL (ref 0.3–1.2)
BUN: 10 mg/dL (ref 6–20)
CHLORIDE: 100 mmol/L (ref 98–111)
CO2: 28 mmol/L (ref 22–32)
Calcium: 9.3 mg/dL (ref 8.9–10.3)
Creatinine, Ser: 1.16 mg/dL — ABNORMAL HIGH (ref 0.44–1.00)
GFR calc Af Amer: >60 mL/min (ref >60)
Glucose, Bld: 112 mg/dL — ABNORMAL HIGH (ref 70–99)
POTASSIUM: 3.5 mmol/L (ref 3.5–5.1)
Sodium: 137 mmol/L (ref 135–145)
TOTAL PROTEIN: 8.4 g/dL — AB (ref 6.5–8.1)

## 2018-04-25 LAB — CBC
HEMATOCRIT: 43.9 % (ref 35.0–47.0)
HEMOGLOBIN: 14.1 g/dL (ref 12.0–16.0)
MCH: 26.8 pg (ref 26.0–34.0)
MCHC: 32.1 g/dL (ref 32.0–36.0)
MCV: 83.3 fL (ref 80.0–100.0)
PLATELETS: 263 10*3/uL (ref 150–440)
RBC: 5.27 MIL/uL — AB (ref 3.80–5.20)
RDW: 15.7 % — AB (ref 11.5–14.5)
WBC: 7 10*3/uL (ref 3.6–11.0)

## 2018-04-25 LAB — URINALYSIS, COMPLETE (UACMP) WITH MICROSCOPIC
BILIRUBIN URINE: NEGATIVE
Glucose, UA: NEGATIVE mg/dL
Ketones, ur: NEGATIVE mg/dL
LEUKOCYTES UA: NEGATIVE
NITRITE: NEGATIVE
PH: 6 (ref 5.0–8.0)
Protein, ur: NEGATIVE mg/dL
SPECIFIC GRAVITY, URINE: 1.019 (ref 1.005–1.030)

## 2018-04-25 LAB — POCT PREGNANCY, URINE: Preg Test, Ur: NEGATIVE

## 2018-04-25 LAB — LIPASE, BLOOD: LIPASE: 25 U/L (ref 11–51)

## 2018-04-25 MED ORDER — ONDANSETRON HCL 4 MG/2ML IJ SOLN
4.0000 mg | Freq: Once | INTRAMUSCULAR | Status: AC | PRN
  Administered 2018-04-25: 4 mg via INTRAVENOUS
  Filled 2018-04-25: qty 2

## 2018-04-25 MED ORDER — SODIUM CHLORIDE 0.9 % IV BOLUS
1000.0000 mL | Freq: Once | INTRAVENOUS | Status: AC
  Administered 2018-04-25: 1000 mL via INTRAVENOUS

## 2018-04-25 MED ORDER — PROMETHAZINE HCL 12.5 MG PO TABS: 12.5000 mg | ORAL_TABLET | Freq: Four times a day (QID) | ORAL | 0 refills | Status: DC | PRN

## 2018-04-25 MED ORDER — PROCHLORPERAZINE EDISYLATE 10 MG/2ML IJ SOLN
10.0000 mg | Freq: Once | INTRAMUSCULAR | Status: AC
  Administered 2018-04-25: 10 mg via INTRAVENOUS
  Filled 2018-04-25: qty 2

## 2018-04-25 NOTE — ED Triage Notes (Addendum)
Says vomiting all night.  Did not have abd pain prior, but it hurts all over now.  Says no one else is sick.   Says she started out with a headache --says she sees shiney spots in vision

## 2018-04-25 NOTE — Discharge Instructions (Signed)

## 2018-04-25 NOTE — ED Notes (Signed)
Pt given ginger ale and saltine crackers for po challenge per md request.

## 2018-04-25 NOTE — ED Provider Notes (Signed)
[facility] Emergency Department Provider Note    First MD Initiated Contact with Patient 04/25/18 1345     (approximate)  I have reviewed the triage vital signs and the nursing notes.   HISTORY  Chief Complaint Emesis and Headache    HPI OLIVETTE BECKMANN is a 32 y.o. female presents the ER with nausea vomiting started earlier this morning having some epigastric discomfort.  Was not have any pain prior to vomiting but is now complaining of crampy abdominal pain all over.  States the pain is mild in severity.  Denies any hematemesis or coffee-ground emesis.  No sick contacts.  Thinks that she got sick after eating chicken salad last night.  Denies any dysuria or flank pain.  States that the pain is midepigastric.    No past medical history on file. No family history on file. No past surgical history on file. Patient Active Problem List   Diagnosis Date Noted  . Abdominal pain 09/17/2015      Prior to Admission medications   Medication Sig Start Date End Date Taking? Authorizing Provider  albuterol (PROVENTIL HFA;VENTOLIN HFA) 108 (90 Base) MCG/ACT inhaler Inhale 2 puffs into the lungs every 4 (four) hours as needed for wheezing or shortness of breath. 03/09/18   Cuthriell, Delorise Royals, PA-C  amoxicillin (AMOXIL) 500 MG tablet Take 1 tablet (500 mg total) by mouth 3 (three) times daily. 12/06/15   Ignacia Bayley, PA-C  brompheniramine-pseudoephedrine-DM 30-2-10 MG/5ML syrup Take 10 mLs by mouth 4 (four) times daily as needed. 03/09/18   Cuthriell, Delorise Royals, PA-C  cetirizine (ZYRTEC) 10 MG tablet Take 1 tablet (10 mg total) by mouth daily. 03/09/18   Cuthriell, Delorise Royals, PA-C  fluticasone (FLONASE) 50 MCG/ACT nasal spray Place 1 spray into both nostrils 2 (two) times daily. 03/09/18   Cuthriell, Delorise Royals, PA-C  Omeprazole 20 MG TBEC Take 2 tablets (40 mg total) by mouth 2 (two) times daily. 09/18/15   Delfino Lovett, MD  predniSONE (DELTASONE) 50 MG tablet Take  1 tablet (50 mg total) by mouth daily with breakfast. 03/09/18   Cuthriell, Delorise Royals, PA-C  promethazine (PHENERGAN) 12.5 MG tablet Take 1 tablet (12.5 mg total) by mouth every 6 (six) hours as needed for nausea or vomiting. 04/25/18   Willy Eddy, MD  traMADol (ULTRAM) 50 MG tablet Take 1 tablet (50 mg total) by mouth every 6 (six) hours as needed. 12/30/17 12/30/18  Darci Current, MD    Allergies Patient has no known allergies.    Social History Social History   Tobacco Use  . Smoking status: Current Every Day Smoker    Packs/day: 0.50    Types: Cigarettes  . Smokeless tobacco: Never Used  Substance Use Topics  . Alcohol use: Yes    Alcohol/week: 0.0 standard drinks    Comment: occ  . Drug use: No    Review of Systems Patient denies headaches, rhinorrhea, blurry vision, numbness, shortness of breath, chest pain, edema, cough, abdominal pain, nausea, vomiting, diarrhea, dysuria, fevers, rashes or hallucinations unless otherwise stated above in HPI. ____________________________________________   PHYSICAL EXAM:  VITAL SIGNS: Vitals:   04/25/18 1152  Pulse: (!) 115  Resp: 16  Temp: 98.4 F (36.9 C)  SpO2: 97%    Constitutional: Alert and oriented.  Eyes: Conjunctivae are normal.  Head: Atraumatic. Nose: No congestion/rhinnorhea. Mouth/Throat: Mucous membranes are moist.   Neck: No stridor. Painless ROM.  Cardiovascular: Normal rate, regular rhythm. Grossly normal heart sounds.  Good  peripheral circulation. Respiratory: Normal respiratory effort.  No retractions. Lungs CTAB. Gastrointestinal: Soft and nontender in all four quadrants. No distention. No abdominal bruits. No CVA tenderness. Genitourinary:  Musculoskeletal: No lower extremity tenderness nor edema.  No joint effusions. Neurologic:  Normal speech and language. No gross focal neurologic deficits are appreciated. No facial droop Skin:  Skin is warm, dry and intact. No rash noted. Psychiatric: Mood  and affect are normal. Speech and behavior are normal.  ____________________________________________   LABS (all labs ordered are listed, but only abnormal results are displayed)  Results for orders placed or performed during the hospital encounter of 04/25/18 (from the past 24 hour(s))  Lipase, blood     Status: None   Collection Time: 04/25/18 12:04 PM  Result Value Ref Range   Lipase 25 11 - 51 U/L  Comprehensive metabolic panel     Status: Abnormal   Collection Time: 04/25/18 12:04 PM  Result Value Ref Range   Sodium 137 135 - 145 mmol/L   Potassium 3.5 3.5 - 5.1 mmol/L   Chloride 100 98 - 111 mmol/L   CO2 28 22 - 32 mmol/L   Glucose, Bld 112 (H) 70 - 99 mg/dL   BUN 10 6 - 20 mg/dL   Creatinine, Ser 8.29 (H) 0.44 - 1.00 mg/dL   Calcium 9.3 8.9 - 56.2 mg/dL   Total Protein 8.4 (H) 6.5 - 8.1 g/dL   Albumin 4.3 3.5 - 5.0 g/dL   AST 85 (H) 15 - 41 U/L   ALT 64 (H) 0 - 44 U/L   Alkaline Phosphatase 66 38 - 126 U/L   Total Bilirubin 1.1 0.3 - 1.2 mg/dL   GFR calc non Af Amer >60 >60 mL/min   GFR calc Af Amer >60 >60 mL/min   Anion gap 9 5 - 15  CBC     Status: Abnormal   Collection Time: 04/25/18 12:04 PM  Result Value Ref Range   WBC 7.0 3.6 - 11.0 K/uL   RBC 5.27 (H) 3.80 - 5.20 MIL/uL   Hemoglobin 14.1 12.0 - 16.0 g/dL   HCT 13.0 86.5 - 78.4 %   MCV 83.3 80.0 - 100.0 fL   MCH 26.8 26.0 - 34.0 pg   MCHC 32.1 32.0 - 36.0 g/dL   RDW 69.6 (H) 29.5 - 28.4 %   Platelets 263 150 - 440 K/uL  Pregnancy, urine POC     Status: None   Collection Time: 04/25/18  2:34 PM  Result Value Ref Range   Preg Test, Ur NEGATIVE NEGATIVE   ____________________________________________ ____________________________________________  RADIOLOGY  I personally reviewed all radiographic images ordered to evaluate for the above acute complaints and reviewed radiology reports and findings.  These findings were personally discussed with the patient.  Please see medical record for radiology  report.  ____________________________________________   PROCEDURES  Procedure(s) performed:  Procedures    Critical Care performed: no ____________________________________________   INITIAL IMPRESSION / ASSESSMENT AND PLAN / ED COURSE  Pertinent labs & imaging results that were available during my care of the patient were reviewed by me and considered in my medical decision making (see chart for details).   DDX: Enteritis, gastritis, cholelithiasis, cholecystitis, pancreatitis, food poisoning, pregnancy  OLEDA BORSKI is a 32 y.o. who presents to the ED with symptoms as described above.  Patient afebrile mildly tachycardic but in no acute distress.  Her abdominal exam is soft and benign.  Blood work is reassuring.  Not clinically consistent with cholelithiasis or  cholecystitis.  No evidence of pancreatitis his lipase is normal.  No leukocytosis.  This is not clinically consistent with acute appendicitis.  No evidence of cystitis.  Patient given IV fluids as well as IV antiemetics with improvement in symptoms.  At this point do believe patient stable and appropriate for outpatient follow-up.      As part of my medical decision making, I reviewed the following data within the electronic MEDICAL RECORD NUMBER Nursing notes reviewed and incorporated, Labs reviewed, notes from prior ED visits.   ____________________________________________   FINAL CLINICAL IMPRESSION(S) / ED DIAGNOSES  Final diagnoses:  Epigastric pain  Non-intractable vomiting with nausea, unspecified vomiting type      NEW MEDICATIONS STARTED DURING THIS VISIT:  New Prescriptions   PROMETHAZINE (PHENERGAN) 12.5 MG TABLET    Take 1 tablet (12.5 mg total) by mouth every 6 (six) hours as needed for nausea or vomiting.     Note:  This document was prepared using Dragon voice recognition software and may include unintentional dictation errors.    Willy Eddy, MD 04/25/18 [phone]

## 2018-04-25 NOTE — ED Notes (Signed)
ED Provider at bedside. 

## 2018-05-02 ENCOUNTER — Emergency Department
Admission: EM | Admit: 2018-05-02 | Discharge: 2018-05-02 | Disposition: A | Discharge status: Home / Self Care | Payer: Self-pay | Attending: Emergency Medicine | Admitting: Emergency Medicine

## 2018-05-02 ENCOUNTER — Encounter: Payer: Self-pay | Admitting: Emergency Medicine

## 2018-05-02 DIAGNOSIS — Y999 Unspecified external cause status: Secondary | ICD-10-CM | POA: Insufficient documentation

## 2018-05-02 DIAGNOSIS — L089 Local infection of the skin and subcutaneous tissue, unspecified: Secondary | ICD-10-CM | POA: Insufficient documentation

## 2018-05-02 DIAGNOSIS — W57XXXA Bitten or stung by nonvenomous insect and other nonvenomous arthropods, initial encounter: Secondary | ICD-10-CM | POA: Insufficient documentation

## 2018-05-02 DIAGNOSIS — F17210 Nicotine dependence, cigarettes, uncomplicated: Secondary | ICD-10-CM | POA: Insufficient documentation

## 2018-05-02 DIAGNOSIS — Y929 Unspecified place or not applicable: Secondary | ICD-10-CM | POA: Insufficient documentation

## 2018-05-02 DIAGNOSIS — Z79899 Other long term (current) drug therapy: Secondary | ICD-10-CM | POA: Insufficient documentation

## 2018-05-02 DIAGNOSIS — Y9389 Activity, other specified: Secondary | ICD-10-CM | POA: Insufficient documentation

## 2018-05-02 DIAGNOSIS — S90464A Insect bite (nonvenomous), right lesser toe(s), initial encounter: Secondary | ICD-10-CM | POA: Insufficient documentation

## 2018-05-02 MED ORDER — LIDOCAINE-EPINEPHRINE-TETRACAINE (LET) SOLUTION
NASAL | Status: AC
  Administered 2018-05-02: 3 mL via TOPICAL
  Filled 2018-05-02: qty 3

## 2018-05-02 MED ORDER — LIDOCAINE-EPINEPHRINE-TETRACAINE (LET) SOLUTION
3.0000 mL | Freq: Once | NASAL | Status: AC
  Administered 2018-05-02: 3 mL via TOPICAL

## 2018-05-02 MED ORDER — NEOMYCIN-POLYMYXIN-PRAMOXINE 1 % EX CREA: TOPICAL_CREAM | Freq: Two times a day (BID) | CUTANEOUS | 0 refills | Status: DC

## 2018-05-02 MED ORDER — SULFAMETHOXAZOLE-TRIMETHOPRIM 800-160 MG PO TABS: 1.0000 | ORAL_TABLET | Freq: Two times a day (BID) | ORAL | 0 refills | Status: DC

## 2018-05-02 MED ORDER — TRAMADOL HCL 50 MG PO TABS: 50.0000 mg | ORAL_TABLET | Freq: Two times a day (BID) | ORAL | 0 refills | Status: DC | PRN

## 2018-05-02 NOTE — Discharge Instructions (Addendum)
Take medication as directed.

## 2018-05-02 NOTE — ED Provider Notes (Signed)
[facility] Emergency Department Provider Note   ____________________________________________   First MD Initiated Contact with Patient 05/02/18 1040     (approximate)  I have reviewed the triage vital signs and the nursing notes.   HISTORY  Chief Complaint Insect Bite     HPI Carolyn Nunez is a 32 y.o. female patient complain of an ulcer with "oozing on the third digit right foot.  Patient states she felt it in sticks being/bite yesterday afternoon.  Area is now red and swollen.  Patient rates the pain as a 6/10.  Patient described the pain is "achy".  Patient denies loss sensation loss of function of the lower extremity.   History reviewed. No pertinent past medical history.  Patient Active Problem List   Diagnosis Date Noted  . Abdominal pain 09/17/2015    History reviewed. No pertinent surgical history.  Prior to Admission medications   Medication Sig Start Date End Date Taking? Authorizing Provider  albuterol (PROVENTIL HFA;VENTOLIN HFA) 108 (90 Base) MCG/ACT inhaler Inhale 2 puffs into the lungs every 4 (four) hours as needed for wheezing or shortness of breath. 03/09/18   Cuthriell, Delorise Royals, PA-C  amoxicillin (AMOXIL) 500 MG tablet Take 1 tablet (500 mg total) by mouth 3 (three) times daily. 12/06/15   Ignacia Bayley, PA-C  brompheniramine-pseudoephedrine-DM 30-2-10 MG/5ML syrup Take 10 mLs by mouth 4 (four) times daily as needed. 03/09/18   Cuthriell, Delorise Royals, PA-C  cetirizine (ZYRTEC) 10 MG tablet Take 1 tablet (10 mg total) by mouth daily. 03/09/18   Cuthriell, Delorise Royals, PA-C  fluticasone (FLONASE) 50 MCG/ACT nasal spray Place 1 spray into both nostrils 2 (two) times daily. 03/09/18   Cuthriell, Delorise Royals, PA-C  neomycin-polymyxin-pramoxine (NEOSPORIN PLUS) 1 % cream Apply topically 2 (two) times daily. 05/02/18   Joni Reining, PA-C  Omeprazole 20 MG TBEC Take 2 tablets (40 mg total) by mouth 2 (two) times daily. 09/18/15   Delfino Lovett, MD  predniSONE (DELTASONE) 50 MG tablet Take 1 tablet (50 mg total) by mouth daily with breakfast. 03/09/18   Cuthriell, Delorise Royals, PA-C  promethazine (PHENERGAN) 12.5 MG tablet Take 1 tablet (12.5 mg total) by mouth every 6 (six) hours as needed for nausea or vomiting. 04/25/18   Willy Eddy, MD  sulfamethoxazole-trimethoprim (BACTRIM DS,SEPTRA DS) 800-160 MG tablet Take 1 tablet by mouth 2 (two) times daily. 05/02/18   Joni Reining, PA-C  traMADol (ULTRAM) 50 MG tablet Take 1 tablet (50 mg total) by mouth every 6 (six) hours as needed. 12/30/17 12/30/18  Darci Current, MD  traMADol (ULTRAM) 50 MG tablet Take 1 tablet (50 mg total) by mouth every 12 (twelve) hours as needed. 05/02/18   Joni Reining, PA-C    Allergies Patient has no known allergies.  No family history on file.  Social History Social History   Tobacco Use  . Smoking status: Current Every Day Smoker    Packs/day: 0.50    Types: Cigarettes  . Smokeless tobacco: Never Used  Substance Use Topics  . Alcohol use: Yes    Alcohol/week: 0.0 standard drinks    Comment: occ  . Drug use: No    Review of Systems Constitutional: No fever/chills Eyes: No visual changes. ENT: No sore throat. Cardiovascular: Denies chest pain. Respiratory: Denies shortness of breath. Gastrointestinal: No abdominal pain.  No nausea, no vomiting.  No diarrhea.  No constipation. Genitourinary: Negative for dysuria. Musculoskeletal: Negative for back pain. Skin: Ulcer lesion third digit  right foot.  Neurological: Negative for headaches, focal weakness or numbness.   ____________________________________________   PHYSICAL EXAM:  VITAL SIGNS: ED Triage Vitals  Enc Vitals Group     BP 05/02/18 0953 115/79     Pulse Rate 05/02/18 0953 81     Resp 05/02/18 0953 18     Temp 05/02/18 0953 98.2 F (36.8 C)     Temp Source 05/02/18 0953 Oral     SpO2 05/02/18 0953 98 %     Weight 05/02/18 0955 197 lb (89.4 kg)     Height  05/02/18 0955 5\' 2"  (1.575 m)     Head Circumference --      Peak Flow --      Pain Score 05/02/18 0955 6     Pain Loc --      Pain Edu? --      Excl. in GC? --    Constitutional: Alert and oriented. Well appearing and in no acute distress. Cardiovascular: Normal rate, regular rhythm. Grossly normal heart sounds.  Good peripheral circulation. Respiratory: Normal respiratory effort.  No retractions. Lungs CTAB. Musculoskeletal: No lower extremity tenderness nor edema.  No joint effusions. Neurologic:  Normal speech and language. No gross focal neurologic deficits are appreciated. No gait instability. Skin:  Skin is warm, dry and intact. No rash noted. Psychiatric: Mood and affect are normal. Speech and behavior are normal.  ____________________________________________   LABS (all labs ordered are listed, but only abnormal results are displayed)  Labs Reviewed - No data to display ____________________________________________  EKG   ____________________________________________  RADIOLOGY  ED MD interpretation:    Official radiology report(s): No results found.  ____________________________________________   PROCEDURES  Procedure(s) performed: None  Procedures  Critical Care performed: No  ____________________________________________   INITIAL IMPRESSION / ASSESSMENT AND PLAN / ED COURSE  As part of my medical decision making, I reviewed the following data within the electronic MEDICAL RECORD NUMBER   Pain to the third digit right foot second insect bite.  Patient given discharge care instruction advised take medication as directed.  [facility].      ____________________________________________   FINAL CLINICAL IMPRESSION(S) / ED DIAGNOSES  Final diagnoses:  Bug bite with infection, initial encounter     ED Discharge Orders         Ordered    sulfamethoxazole-trimethoprim (BACTRIM DS,SEPTRA DS) 800-160 MG  tablet  2 times daily     05/02/18 1112    neomycin-polymyxin-pramoxine (NEOSPORIN PLUS) 1 % cream  2 times daily     05/02/18 1112    traMADol (ULTRAM) 50 MG tablet  Every 12 hours PRN     05/02/18 1112           Note:  This document was prepared using Dragon voice recognition software and may include unintentional dictation errors.    Joni Reining, PA-C 05/02/18 1118    Governor Rooks, MD 05/02/18 [phone]

## 2018-05-02 NOTE — ED Triage Notes (Signed)
Pt reports got bit last pm on her right foot and now it is painful and oozing. Area noted to right foot 4th toe.

## 2018-06-19 ENCOUNTER — Other Ambulatory Visit: Payer: Self-pay

## 2018-06-19 ENCOUNTER — Emergency Department
Admission: EM | Admit: 2018-06-19 | Discharge: 2018-06-20 | Disposition: A | Discharge status: Home / Self Care | Payer: Self-pay | Attending: Emergency Medicine | Admitting: Emergency Medicine

## 2018-06-19 ENCOUNTER — Encounter: Payer: Self-pay | Admitting: Emergency Medicine

## 2018-06-19 DIAGNOSIS — Z79899 Other long term (current) drug therapy: Secondary | ICD-10-CM | POA: Insufficient documentation

## 2018-06-19 DIAGNOSIS — F32A Depression, unspecified: Secondary | ICD-10-CM

## 2018-06-19 DIAGNOSIS — F10920 Alcohol use, unspecified with intoxication, uncomplicated: Secondary | ICD-10-CM

## 2018-06-19 DIAGNOSIS — F329 Major depressive disorder, single episode, unspecified: Secondary | ICD-10-CM | POA: Insufficient documentation

## 2018-06-19 DIAGNOSIS — F17210 Nicotine dependence, cigarettes, uncomplicated: Secondary | ICD-10-CM | POA: Insufficient documentation

## 2018-06-19 DIAGNOSIS — F1410 Cocaine abuse, uncomplicated: Secondary | ICD-10-CM | POA: Insufficient documentation

## 2018-06-19 DIAGNOSIS — F10929 Alcohol use, unspecified with intoxication, unspecified: Secondary | ICD-10-CM | POA: Insufficient documentation

## 2018-06-19 LAB — COMPREHENSIVE METABOLIC PANEL
ALK PHOS: 52 U/L (ref 38–126)
ALT: 19 U/L (ref 0–44)
AST: 23 U/L (ref 15–41)
Albumin: 4 g/dL (ref 3.5–5.0)
Anion gap: 8 (ref 5–15)
BUN: 9 mg/dL (ref 6–20)
CALCIUM: 9.1 mg/dL (ref 8.9–10.3)
CHLORIDE: 103 mmol/L (ref 98–111)
CO2: 28 mmol/L (ref 22–32)
CREATININE: 0.92 mg/dL (ref 0.44–1.00)
GFR calc Af Amer: >60 mL/min (ref >60)
Glucose, Bld: 86 mg/dL (ref 70–99)
Potassium: 3.7 mmol/L (ref 3.5–5.1)
Sodium: 139 mmol/L (ref 135–145)
Total Bilirubin: 0.6 mg/dL (ref 0.3–1.2)
Total Protein: 7.5 g/dL (ref 6.5–8.1)

## 2018-06-19 LAB — CBC
HCT: 40.1 % (ref 36.0–46.0)
Hemoglobin: 12.8 g/dL (ref 12.0–15.0)
MCH: 26.2 pg (ref 26.0–34.0)
MCHC: 31.9 g/dL (ref 30.0–36.0)
MCV: 82.2 fL (ref 80.0–100.0)
PLATELETS: 275 10*3/uL (ref 150–400)
RBC: 4.88 MIL/uL (ref 3.87–5.11)
RDW: 15.8 % — ABNORMAL HIGH (ref 11.5–15.5)
WBC: 9 10*3/uL (ref 4.0–10.5)
nRBC: 0 % (ref 0.0–0.2)

## 2018-06-19 LAB — SALICYLATE LEVEL

## 2018-06-19 LAB — ETHANOL: Alcohol, Ethyl (B): 43 mg/dL — ABNORMAL HIGH (ref <10)

## 2018-06-19 LAB — ACETAMINOPHEN LEVEL

## 2018-06-19 NOTE — ED Triage Notes (Signed)
PT arrives via ems for alcohol intoxication. Pt states she is depressed but denies SI/HI

## 2018-06-19 NOTE — ED Notes (Signed)
Lab work ordered and being drawn, also will monitor 02 sat and b/p per MD.

## 2018-06-19 NOTE — ED Notes (Signed)
First Nurse Note: Pt to ED via [facility] for anxiety and stress and alcohol intoxication. Pt in NAD

## 2018-06-19 NOTE — ED Notes (Signed)
Patient's mom is here and sitting by the Patient and nurse talked with her and Doctor is going to speak to her also. Patient is heavily sedated, awaiting lab results, she is sleeping, but will open eyes to verbal stimuli, monitoring b/p and 02 sat.

## 2018-06-19 NOTE — BH Assessment (Signed)
Writer unable to complete TTS Consult. Patient unable to participate in the assessment.

## 2018-06-19 NOTE — ED Notes (Signed)
Report from Wendy RN. Patient sleeping, respirations regular and unlabored. Q15 minute rounds and observation by Rover and Officer to continue.  

## 2018-06-19 NOTE — Progress Notes (Signed)
   06/19/18 1215  Clinical Encounter Type  Visited With Patient  Visit Type Initial  Referral From Other (Comment)  Consult/Referral To Chaplain  Spiritual Encounters  Spiritual Needs Emotional;Grief support  CH received suggestion from care team that patient could use pastoral care. Patient was seated in wheelchair in triage. Knelt down next to patient and provided pastoral care. [facility]. Chartered loss adjusterAuthor provided emotional support. Validated feelings. Was a hopeful presence.

## 2018-06-19 NOTE — ED Provider Notes (Signed)
[facility] Emergency Department Provider Note  Time seen: 1:13 PM  I have reviewed the triage vital signs and the nursing notes.   HISTORY  Chief Complaint Alcohol Intoxication    HPI Carolyn Nunez is a 32 y.o. female with no significant past medical history presents to the emergency department with intoxication and depression.  According to EMS and the patient patient was brought via EMS to the emergency department for depression after spending Thanksgiving alone.  Has been drinking heavily.  Patient states she did not drink today but drank and used cocaine last night.  Denies any other substance use.  Patient is extremely somnolent and he have to wake up multiple times throughout the evaluation to get her to answer questions.  Largely negative review of systems at this time.  Admits depression but denies any SI or HI.   History reviewed. No pertinent past medical history.  Patient Active Problem List   Diagnosis Date Noted  . Abdominal pain 09/17/2015    History reviewed. No pertinent surgical history.  Prior to Admission medications   Medication Sig Start Date End Date Taking? Authorizing Provider  albuterol (PROVENTIL HFA;VENTOLIN HFA) 108 (90 Base) MCG/ACT inhaler Inhale 2 puffs into the lungs every 4 (four) hours as needed for wheezing or shortness of breath. 03/09/18   Cuthriell, Delorise RoyalsJonathan D, PA-C  amoxicillin (AMOXIL) 500 MG tablet Take 1 tablet (500 mg total) by mouth 3 (three) times daily. 12/06/15   Ignacia Bayleyumey, Robert, PA-C  brompheniramine-pseudoephedrine-DM 30-2-10 MG/5ML syrup Take 10 mLs by mouth 4 (four) times daily as needed. 03/09/18   Cuthriell, Delorise RoyalsJonathan D, PA-C  cetirizine (ZYRTEC) 10 MG tablet Take 1 tablet (10 mg total) by mouth daily. 03/09/18   Cuthriell, Delorise RoyalsJonathan D, PA-C  fluticasone (FLONASE) 50 MCG/ACT nasal spray Place 1 spray into both nostrils 2 (two) times daily. 03/09/18   Cuthriell, Delorise RoyalsJonathan D, PA-C  neomycin-polymyxin-pramoxine  (NEOSPORIN PLUS) 1 % cream Apply topically 2 (two) times daily. 05/02/18   Joni ReiningSmith, Ronald K, PA-C  Omeprazole 20 MG TBEC Take 2 tablets (40 mg total) by mouth 2 (two) times daily. 09/18/15   Delfino LovettShah, Vipul, MD  predniSONE (DELTASONE) 50 MG tablet Take 1 tablet (50 mg total) by mouth daily with breakfast. 03/09/18   Cuthriell, Delorise RoyalsJonathan D, PA-C  promethazine (PHENERGAN) 12.5 MG tablet Take 1 tablet (12.5 mg total) by mouth every 6 (six) hours as needed for nausea or vomiting. 04/25/18   Willy Eddyobinson, Patrick, MD  sulfamethoxazole-trimethoprim (BACTRIM DS,SEPTRA DS) 800-160 MG tablet Take 1 tablet by mouth 2 (two) times daily. 05/02/18   Joni ReiningSmith, Ronald K, PA-C  traMADol (ULTRAM) 50 MG tablet Take 1 tablet (50 mg total) by mouth every 6 (six) hours as needed. 12/30/17 12/30/18  Darci CurrentBrown, [place] N, MD  traMADol (ULTRAM) 50 MG tablet Take 1 tablet (50 mg total) by mouth every 12 (twelve) hours as needed. 05/02/18   Joni ReiningSmith, Ronald K, PA-C    No Known Allergies  No family history on file.  Social History Social History   Tobacco Use  . Smoking status: Current Every Day Smoker    Packs/day: 0.50    Types: Cigarettes  . Smokeless tobacco: Never Used  Substance Use Topics  . Alcohol use: Yes    Alcohol/week: 0.0 standard drinks    Comment: occ  . Drug use: No    Review of Systems Constitutional: Negative for fever. Cardiovascular: Negative for chest pain. Respiratory: Negative for shortness of breath. Gastrointestinal: Negative for abdominal pain Musculoskeletal: Negative  for musculoskeletal complaints Neurological: Negative for headache All other ROS negative  ____________________________________________   PHYSICAL EXAM:  VITAL SIGNS: ED Triage Vitals  Enc Vitals Group     BP 06/19/18 1235 (!) 123/94     Pulse Rate 06/19/18 1235 94     Resp 06/19/18 1235 18     Temp 06/19/18 1235 98.7 F (37.1 C)     Temp Source 06/19/18 1235 Oral     SpO2 06/19/18 1235 100 %     Weight 06/19/18 1236 190 lb  (86.2 kg)     Height 06/19/18 1236 5\' 2"  (1.575 m)     Head Circumference --      Peak Flow --      Pain Score 06/19/18 1236 0     Pain Loc --      Pain Edu? --      Excl. in GC? --    Constitutional: Patient is very somnolent, does awaken to voice some mild physical stimuli but falls asleep without continued stimuli.  Will answer questions and follow commands briefly before falling back asleep. Eyes: Normal exam, 2 to 3 mm. ENT   Head: Normocephalic and atraumatic.   Mouth/Throat: Mucous membranes are moist. Cardiovascular: Normal rate, regular rhythm. No murmur Respiratory: Normal respiratory effort without tachypnea nor retractions. Breath sounds are clear Gastrointestinal: Soft and nontender. No distention.   Musculoskeletal: Nontender with normal range of motion in all extremities.  Neurologic:  Normal speech and language. No gross focal neurologic deficits  Skin:  Skin is warm, dry and intact.  Psychiatric: Somnolent, admits depression but denies SI or HI.  ____________________________________________   INITIAL IMPRESSION / ASSESSMENT AND PLAN / ED COURSE  Pertinent labs & imaging results that were available during my care of the patient were reviewed by me and considered in my medical decision making (see chart for details).  Patient presents to the emergency department for intoxication and depression.  Denies SI or HI does not currently meet IVC criteria.  Patient is extremely somnolent, have to awaken her multiple times during evaluation to get her to answer questions.  States she did not drink anything today but drank yesterday does not know when she stopped drinking.  Admits using cocaine last night as her only other substance.  We will check labs, maintain on pulse oximeter in the emergency department, we will check a urinalysis and urine drug screen and continue to closely monitor.  We will have TTS and psychiatry see the patient when she is more alert.  Mom is here  now with the patient is says the patient lives with her normally, but since Wednesday the patient has been staying with her friend.  Mom states she went to the friend's workplace today which was a rest home, found the patient to be nearly unresponsive, very somnolent.  She called EMS to bring the patient to the emergency department for work-up.  Mom states the patient told her that she did use Xanax and drink alcohol.  Patient is currently sleeping.  Blood work has resulted largely within normal limits with a mildly elevated alcohol level.  Urine drug screen pending.  ____________________________________________   FINAL CLINICAL IMPRESSION(S) / ED DIAGNOSES  Intoxication Depression    Minna Antis, MD 06/19/18 1517

## 2018-06-19 NOTE — ED Notes (Signed)
Pt asleep at this time. Will continue to monitor Q15 mins.

## 2018-06-19 NOTE — ED Notes (Signed)
SOC called, unable to interview due to pt alertness.  Need to call again once pt is more alert

## 2018-06-19 NOTE — ED Notes (Signed)
Nurse talked with Patient's mom again and she states that her daughter has been taking xanax and drinking over the last 6 months and hanging with the wrong crowd. Patient remains sleeping, v/s stable.

## 2018-06-19 NOTE — ED Notes (Signed)
Received report from Triage nurse, Patient transferred via w/c to 19h, Patient laid down, and went to sleep right away. Nurse will continue to monitor.

## 2018-06-20 NOTE — ED Notes (Signed)
Hourly rounding reveals patient in room. No complaints, stable, in no acute distress. Q15 minute rounds and monitoring via Rover and Officer to continue.   

## 2018-06-20 NOTE — ED Notes (Signed)
Hourly rounding reveals patient in room. No complaints, stable, in no acute distress. Q15 minute rounds and monitoring via Security Cameras to continue. 

## 2018-06-20 NOTE — ED Notes (Signed)
Pt discharged to home via cab. Discharge instruction given and patient verbalized understanding. Pt alert, awake and NAD.

## 2018-06-20 NOTE — Discharge Instructions (Signed)
Drink alcohol in moderation. Return to the ER for worsening symptoms, feelings of hurting yourself or others, or other concerns.

## 2018-06-20 NOTE — ED Provider Notes (Signed)
-----------------------------------------   8:25 AM on 06/20/2018 -----------------------------------------  Addendum on chart review: Patient awoke around 1am. She was sober and ambulatory with steady gait. Denied active SI/HI/AH/VH. She made several phone calls but was unable to find a ride home. She was discharged home in stable condition via taxi. Strict return precautions were given. Patient verbalized understanding and agreed with plan of care.   Irean HongSung, Sashia Campas J, MD 06/20/18 [phone]

## 2018-12-07 ENCOUNTER — Emergency Department
Admission: EM | Admit: 2018-12-07 | Discharge: 2018-12-07 | Disposition: A | Discharge status: Home / Self Care | Payer: Self-pay | Attending: Emergency Medicine | Admitting: Emergency Medicine

## 2018-12-07 ENCOUNTER — Encounter: Payer: Self-pay | Admitting: Emergency Medicine

## 2018-12-07 ENCOUNTER — Emergency Department: Payer: Self-pay

## 2018-12-07 ENCOUNTER — Other Ambulatory Visit: Payer: Self-pay

## 2018-12-07 DIAGNOSIS — R2241 Localized swelling, mass and lump, right lower limb: Secondary | ICD-10-CM | POA: Insufficient documentation

## 2018-12-07 DIAGNOSIS — M79671 Pain in right foot: Secondary | ICD-10-CM | POA: Insufficient documentation

## 2018-12-07 DIAGNOSIS — F17210 Nicotine dependence, cigarettes, uncomplicated: Secondary | ICD-10-CM | POA: Insufficient documentation

## 2018-12-07 LAB — URIC ACID: Uric Acid, Serum: 5.3 mg/dL (ref 2.5–7.1)

## 2018-12-07 MED ORDER — HYDROCODONE-ACETAMINOPHEN 5-325 MG PO TABS
1.0000 | ORAL_TABLET | Freq: Once | ORAL | Status: AC
  Administered 2018-12-07: 1 via ORAL
  Filled 2018-12-07: qty 1

## 2018-12-07 MED ORDER — PREDNISONE 20 MG PO TABS: 20.0000 mg | ORAL_TABLET | Freq: Two times a day (BID) | ORAL | 0 refills | Status: AC

## 2018-12-07 MED ORDER — TRAMADOL HCL 50 MG PO TABS: 50.0000 mg | ORAL_TABLET | Freq: Two times a day (BID) | ORAL | 0 refills | Status: DC

## 2018-12-07 NOTE — ED Triage Notes (Signed)
Pt states she woke up with right foot pain, unknown injury, limping to triage desk. NAD.

## 2018-12-07 NOTE — ED Notes (Signed)
Pt states she woke up with right foot hurting 4 days ago. Denies any recent injuries. Swelling noted on the inside of right foot extending down into the arch. Pt states any time she attempts to walk or place weight on foot she experiences a sharp pain.

## 2018-12-07 NOTE — Discharge Instructions (Addendum)
Your exam and XR are negative for fracture or gout as the cause of your foot pain and swelling. You symptoms may represent some form of tendinitis to the foot/ankle complex. Wear the ace bandage and post-op shoe as needed for comfort. Rest with the foot elevated for reduction of pain and swelling. Follow-up with Podiatry for ongoing symptoms.

## 2018-12-07 NOTE — ED Provider Notes (Signed)
[facility] Emergency Department Provider Note ____________________________________________  Time seen: 1056  I have reviewed the triage vital signs and the nursing notes.  HISTORY  Chief Complaint  Foot Pain  HPI Carolyn Nunez is a 33 y.o. female presents herself to the ED for a ration of right foot pain.  Patient denies any known injury.  She reports pain and swelling to the medial aspect of the ankle at the Achilles fossa.  She also reports pain to the heel and increased pain with attempts to bear weight to the foot.  She is been walking on her toes as she is been unable to bear weight through her heel.  She denies any preceding injury, trauma, or fall.  She also denies any paresthesias, edema, or plantar fascia pain.  She has been taking ibuprofen but denies any significant benefit.  She presents now for evaluation.  History reviewed. No pertinent past medical history.  Patient Active Problem List   Diagnosis Date Noted  . Abdominal pain 09/17/2015    History reviewed. No pertinent surgical history.  Prior to Admission medications   Medication Sig Start Date End Date Taking? Authorizing Provider  albuterol (PROVENTIL HFA;VENTOLIN HFA) 108 (90 Base) MCG/ACT inhaler Inhale 2 puffs into the lungs every 4 (four) hours as needed for wheezing or shortness of breath. 03/09/18   Cuthriell, Delorise Royals, PA-C  Omeprazole 20 MG TBEC Take 2 tablets (40 mg total) by mouth 2 (two) times daily. 09/18/15   Delfino Lovett, MD  predniSONE (DELTASONE) 20 MG tablet Take 1 tablet (20 mg total) by mouth 2 (two) times daily with a meal for 5 days. 12/07/18 12/12/18  Peytin Dechert, Charlesetta Ivory, PA-C  traMADol (ULTRAM) 50 MG tablet Take 1 tablet (50 mg total) by mouth 2 (two) times daily. 12/07/18   Myesha Stillion, Charlesetta Ivory, PA-C    Allergies Patient has no known allergies.  No family history on file.  Social History Social History   Tobacco Use  . Smoking status: Current Every Day  Smoker    Packs/day: 0.50    Types: Cigarettes  . Smokeless tobacco: Never Used  Substance Use Topics  . Alcohol use: Yes    Alcohol/week: 0.0 standard drinks    Comment: occ  . Drug use: No    Review of Systems  Constitutional: Negative for fever. Eyes: Negative for visual changes. ENT: Negative for sore throat. Cardiovascular: Negative for chest pain. Respiratory: Negative for shortness of breath. Gastrointestinal: Negative for abdominal pain, vomiting and diarrhea. Genitourinary: Negative for dysuria. Musculoskeletal: Negative for back pain.  Right foot pain as above. Skin: Negative for rash. Neurological: Negative for headaches, focal weakness or numbness. ____________________________________________  PHYSICAL EXAM:  VITAL SIGNS: ED Triage Vitals  Enc Vitals Group     BP 12/07/18 1008 111/75     Pulse Rate 12/07/18 1008 74     Resp 12/07/18 1008 16     Temp 12/07/18 1008 98.4 F (36.9 C)     Temp Source 12/07/18 1008 Oral     SpO2 12/07/18 1008 98 %     Weight 12/07/18 1006 215 lb (97.5 kg)     Height 12/07/18 1006 5\' 3"  (1.6 m)     Head Circumference --      Peak Flow --      Pain Score 12/07/18 1006 8     Pain Loc --      Pain Edu? --      Excl. in [place]? --  Constitutional: Alert and oriented. Well appearing and in no distress. Head: Normocephalic and atraumatic. Eyes: Conjunctivae are normal. Normal extraocular movements Cardiovascular: Normal rate, regular rhythm. Normal distal pulses. Respiratory: Normal respiratory effort.  Musculoskeletal: Right foot without obvious deformity or dislocation.  The medial Achilles fossa with some mild subtle swelling.  Patient is exquisitely tender to palpation in his region.  She is also tender to palpation in the instep as well as the plantar heel.  No calf or Achilles insertion tenderness is appreciated.  Nontender with normal range of motion in all extremities.  Neurologic: Antalgic gait without ataxia. Normal speech  and language. No gross focal neurologic deficits are appreciated. Skin:  Skin is warm, dry and intact. No rash noted.  No clubbing, cyanosis, or edema appreciated. Psychiatric: Mood and affect are normal. Patient exhibits appropriate insight and judgment. ____________________________________________   LABS (pertinent positives/negatives) Labs Reviewed  URIC ACID  ____________________________________________   RADIOLOGY  Right Foot Negative  ____________________________________________  PROCEDURES  Procedures Norco 5-325 mg PO Ace bandage Post-op shoe ____________________________________________  INITIAL IMPRESSION / ASSESSMENT AND PLAN / ED COURSE  Billy FischerLatasha L Hirschmann was evaluated in Emergency Department on 12/07/2018 for the symptoms described in the history of present illness. She was evaluated in the context of the global COVID-19 pandemic, which necessitated consideration that the patient might be at risk for infection with the SARS-CoV-2 virus that causes COVID-19. Institutional protocols and algorithms that pertain to the evaluation of patients at risk for COVID-19 are in a state of rapid change based on information released by regulatory bodies including the CDC and federal and state organizations. These policies and algorithms were followed during the patient's care in the ED.  DDX: sprain, fracture, gout, tendinitis  Patient with ED evaluation of acute right foot pain with pain and swelling to the medial Achilles fossa and calcaneal heel.  Patient exam is overall benign and reassuring.  No indication of an acute gouty attack is uric acid is within normal levels.  Her x-ray is also negative for any acute fracture or dislocation.  Symptoms may represent a tendinitis to the foot or ankle junction.  She will be placed in a postop shoe, with weight-bear as tolerated.  She is also discharged with scription for prednisone and Ultram to take as directed but she is referred to podiatry for  further evaluation management. ____________________________________________  FINAL CLINICAL IMPRESSION(S) / ED DIAGNOSES  Final diagnoses:  Foot pain, right      Leeandra Ellerson, Charlesetta IvoryJenise V Bacon, PA-C 12/07/18 1733    Jeanmarie PlantMcShane, James A, MD 12/09/18 0000

## 2019-06-28 ENCOUNTER — Other Ambulatory Visit: Payer: Self-pay

## 2019-06-28 ENCOUNTER — Emergency Department
Admission: EM | Admit: 2019-06-28 | Discharge: 2019-06-29 | Disposition: A | Discharge status: Home / Self Care | Payer: Self-pay | Attending: Emergency Medicine | Admitting: Emergency Medicine

## 2019-06-28 DIAGNOSIS — Z79899 Other long term (current) drug therapy: Secondary | ICD-10-CM | POA: Insufficient documentation

## 2019-06-28 DIAGNOSIS — M2540 Effusion, unspecified joint: Secondary | ICD-10-CM

## 2019-06-28 DIAGNOSIS — F17210 Nicotine dependence, cigarettes, uncomplicated: Secondary | ICD-10-CM | POA: Insufficient documentation

## 2019-06-28 DIAGNOSIS — R2243 Localized swelling, mass and lump, lower limb, bilateral: Secondary | ICD-10-CM | POA: Insufficient documentation

## 2019-06-28 LAB — COMPREHENSIVE METABOLIC PANEL
ALT: 16 U/L (ref 0–44)
AST: 20 U/L (ref 15–41)
Albumin: 4 g/dL (ref 3.5–5.0)
Alkaline Phosphatase: 64 U/L (ref 38–126)
Anion gap: 9 (ref 5–15)
BUN: 8 mg/dL (ref 6–20)
CO2: 25 mmol/L (ref 22–32)
Calcium: 8.8 mg/dL — ABNORMAL LOW (ref 8.9–10.3)
Chloride: 106 mmol/L (ref 98–111)
Creatinine, Ser: 0.99 mg/dL (ref 0.44–1.00)
GFR calc Af Amer: >60 mL/min (ref >60)
GFR calc non Af Amer: >60 mL/min (ref >60)
Glucose, Bld: 89 mg/dL (ref 70–99)
Potassium: 3.3 mmol/L — ABNORMAL LOW (ref 3.5–5.1)
Sodium: 140 mmol/L (ref 135–145)
Total Bilirubin: 0.6 mg/dL (ref 0.3–1.2)
Total Protein: 7.3 g/dL (ref 6.5–8.1)

## 2019-06-28 LAB — CBC
HCT: 38 % (ref 36.0–46.0)
Hemoglobin: 12.2 g/dL (ref 12.0–15.0)
MCH: 26.3 pg (ref 26.0–34.0)
MCHC: 32.1 g/dL (ref 30.0–36.0)
MCV: 81.9 fL (ref 80.0–100.0)
Platelets: 262 10*3/uL (ref 150–400)
RBC: 4.64 MIL/uL (ref 3.87–5.11)
RDW: 16.3 % — ABNORMAL HIGH (ref 11.5–15.5)
WBC: 9 10*3/uL (ref 4.0–10.5)
nRBC: 0 % (ref 0.0–0.2)

## 2019-06-28 MED ORDER — KETOROLAC TROMETHAMINE 30 MG/ML IJ SOLN
30.0000 mg | Freq: Once | INTRAMUSCULAR | Status: AC
  Administered 2019-06-29: 30 mg via INTRAMUSCULAR
  Filled 2019-06-28: qty 1

## 2019-06-28 MED ORDER — CEFTRIAXONE SODIUM 250 MG IJ SOLR
250.0000 mg | Freq: Once | INTRAMUSCULAR | Status: AC
  Administered 2019-06-29: 250 mg via INTRAMUSCULAR
  Filled 2019-06-28: qty 250

## 2019-06-28 MED ORDER — ACETAMINOPHEN 500 MG PO TABS
1000.0000 mg | ORAL_TABLET | Freq: Once | ORAL | Status: AC
  Administered 2019-06-29: 1000 mg via ORAL
  Filled 2019-06-28: qty 2

## 2019-06-28 MED ORDER — OXYCODONE HCL 5 MG PO TABS
5.0000 mg | ORAL_TABLET | Freq: Once | ORAL | Status: AC
  Administered 2019-06-29: 5 mg via ORAL
  Filled 2019-06-28: qty 1

## 2019-06-28 MED ORDER — AZITHROMYCIN 500 MG PO TABS
1000.0000 mg | ORAL_TABLET | Freq: Once | ORAL | Status: AC
  Administered 2019-06-29: 1000 mg via ORAL
  Filled 2019-06-28: qty 2

## 2019-06-28 NOTE — ED Triage Notes (Signed)
Pt in with co bilat feet and ankle pain for 2 years. HAs been dx with tendonitis, but states symptoms are worsening.

## 2019-06-28 NOTE — ED Provider Notes (Signed)
[facility] Emergency Department Provider Note  ____________________________________________   First MD Initiated Contact with Patient 06/28/19 2329     (approximate)  I have reviewed the triage vital signs and the nursing notes.   HISTORY  Chief Complaint Joint Swelling    HPI Carolyn Nunez is a 33 y.o. female who presents with bilateral feet and ankle pain for 2 years.  Patient states that she is had this pain that is been more mild for a long time however starting yesterday she had some increased swelling over the bilateral ankles, that is intermittent, worse after standing, better at rest associated with moderate pain.  She is also had some redness on the left ankle.  She currently does not have any redness now but she did show me a picture of some redness earlier.  She denies any trauma.  She states that her mom does have lupus and a family history of rheumatoid arthritis.  She denies any leg swelling other than at the ankles.  She denies any risk factors for DVT.  She is on any estrogen.  She denies any fevers.  She has been sexually active without condoms.  No recent STD testing.  Medical: None  Patient Active Problem List   Diagnosis Date Noted  . Abdominal pain 09/17/2015    No past surgical history on file.  Prior to Admission medications   Medication Sig Start Date End Date Taking? Authorizing Provider  albuterol (PROVENTIL HFA;VENTOLIN HFA) 108 (90 Base) MCG/ACT inhaler Inhale 2 puffs into the lungs every 4 (four) hours as needed for wheezing or shortness of breath. 03/09/18   Cuthriell, Delorise Royals, PA-C  Omeprazole 20 MG TBEC Take 2 tablets (40 mg total) by mouth 2 (two) times daily. 09/18/15   Delfino Lovett, MD  traMADol (ULTRAM) 50 MG tablet Take 1 tablet (50 mg total) by mouth 2 (two) times daily. 12/07/18   Menshew, Charlesetta Ivory, PA-C    Allergies Patient has no known allergies.  No family history on file.  Social History Social  History   Tobacco Use  . Smoking status: Current Every Day Smoker    Packs/day: 0.50    Types: Cigarettes  . Smokeless tobacco: Never Used  Substance Use Topics  . Alcohol use: Yes    Alcohol/week: 0.0 standard drinks    Comment: occ  . Drug use: No      Review of Systems Constitutional: No fever/chills Eyes: No visual changes. ENT: No sore throat. Cardiovascular: Denies chest pain. Respiratory: Denies shortness of breath. Gastrointestinal: No abdominal pain.  No nausea, no vomiting.  No diarrhea.  No constipation. Genitourinary: Negative for dysuria. Musculoskeletal: Negative for back pain.  Pain and swelling in her bilateral ankles Skin: Negative for rash. Neurological: Negative for headaches, focal weakness or numbness. All other ROS negative ____________________________________________   PHYSICAL EXAM:  VITAL SIGNS: ED Triage Vitals [06/28/19 1923]  Enc Vitals Group     BP 118/88     Pulse Rate 86     Resp 20     Temp 99.1 F (37.3 C)     Temp Source Oral     SpO2 100 %     Weight 183 lb (83 kg)     Height 5\' 2"  (1.575 m)     Head Circumference      Peak Flow      Pain Score 8     Pain Loc      Pain Edu?  Excl. in [place]?     Constitutional: Alert and oriented. Well appearing and in no acute distress. Eyes: Conjunctivae are normal. EOMI. Head: Atraumatic. Nose: No congestion/rhinnorhea. Mouth/Throat: Mucous membranes are moist.   Neck: No stridor. Trachea Midline. FROM Cardiovascular: Normal rate, regular rhythm. Grossly normal heart sounds.  Good peripheral circulation. Respiratory: Normal respiratory effort.  No retractions. Lungs CTAB. Gastrointestinal: Soft and nontender. No distention. No abdominal bruits.  Musculoskeletal: Tenderness along both of her ankles.  Maybe some minimal swelling but no pitting edema, no bruising, no erythema, no warmth.  Normal range of motion.  No joint effusions.  Patient is able to ambulate although she does endorse  pain with it. Neurologic:  Normal speech and language. No gross focal neurologic deficits are appreciated.  Skin:  Skin is warm, dry and intact. No rash noted. Psychiatric: Mood and affect are normal. Speech and behavior are normal. GU: Deferred   ____________________________________________   LABS (all labs ordered are listed, but only abnormal results are displayed)  Labs Reviewed  CBC - Abnormal; Notable for the following components:      Result Value   RDW 16.3 (*)    All other components within normal limits  COMPREHENSIVE METABOLIC PANEL - Abnormal; Notable for the following components:   Potassium 3.3 (*)    Calcium 8.8 (*)    All other components within normal limits  GC/CHLAMYDIA PROBE AMP   ____________________________________________  INITIAL IMPRESSION / ASSESSMENT AND PLAN / ED COURSE  Carolyn Nunez was evaluated in Emergency Department on 06/28/2019 for the symptoms described in the history of present illness. She was evaluated in the context of the global COVID-19 pandemic, which necessitated consideration that the patient might be at risk for infection with the SARS-CoV-2 virus that causes COVID-19. Institutional protocols and algorithms that pertain to the evaluation of patients at risk for COVID-19 are in a state of rapid change based on information released by regulatory bodies including the CDC and federal and state organizations. These policies and algorithms were followed during the patient's care in the ED.    Patient is a well-appearing 33 year old who presents with bilateral swelling and pain.  Patient does agree that is not currently swelling swollen now but is intermittent in nature and she did show me a picture of some redness earlier.  She denies any injuries to suggest fracture or sprain.  I think x-ray would have low utility.  Consider gout, pseudogout, tendinitis, other rheumatological conditions given FH significant for lupus/arthritis.  Patient is  sexually active and is not had any recent STD testing.  She denies any symptoms but would like to be tested and get prophylactic rx.  She has no symptoms of urethritis or conjunctivitis to suggest reactive arthritis.  The joint does not appear septic at this time.  I do not think a tap would be beneficial.  She is also been afebrile and has no evidence of bacteremia.  White count is normal making infection less likely.  No evidence of anemia.  No evidence of significant electrolyte abnormalities.  We will give Toradol, postop she given she says that this helped previously when she was here, dose of oxycodone.  Will treat prophylactically for STDs.  We will send patient home on ibuprofen 800 3 times daily for next 5 days with opioids for breakthrough pain.  Strict do not drive or operate machinery while on this.  Patient will follow up for further rheumatoid testing.   I discussed the provisional nature of ED  diagnosis, the treatment so far, the ongoing plan of care, follow up appointments and return precautions with the patient and any family or support people present. They expressed understanding and agreed with the plan, discharged home.   ____________________________________________   FINAL CLINICAL IMPRESSION(S) / ED DIAGNOSES   Final diagnoses:  Joint swelling      MEDICATIONS GIVEN DURING THIS VISIT:  Medications  ketorolac (TORADOL) 30 MG/ML injection 30 mg (has no administration in time range)  acetaminophen (TYLENOL) tablet 1,000 mg (has no administration in time range)  cefTRIAXone (ROCEPHIN) injection 250 mg (has no administration in time range)  azithromycin (ZITHROMAX) tablet 1,000 mg (has no administration in time range)  oxyCODONE (Oxy IR/ROXICODONE) immediate release tablet 5 mg (has no administration in time range)     ED Discharge Orders         Ordered    ibuprofen (ADVIL) 800 MG tablet  Every 6 hours PRN     06/29/19 0001    oxyCODONE (ROXICODONE) 5 MG  immediate release tablet  Every 6 hours PRN     06/29/19 0001           Note:  This document was prepared using Dragon voice recognition software and may include unintentional dictation errors.   Concha SeFunke, Latrisa Hellums E, MD 06/29/19 [phone]

## 2019-06-29 MED ORDER — LIDOCAINE HCL (PF) 1 % IJ SOLN
INTRAMUSCULAR | Status: AC
  Administered 2019-06-29: 0.9 mL
  Filled 2019-06-29: qty 5

## 2019-06-29 MED ORDER — OXYCODONE HCL 5 MG PO TABS: 5.0000 mg | ORAL_TABLET | Freq: Four times a day (QID) | ORAL | 0 refills | Status: AC | PRN

## 2019-06-29 MED ORDER — IBUPROFEN 800 MG PO TABS: 800.0000 mg | ORAL_TABLET | Freq: Four times a day (QID) | ORAL | 0 refills | Status: AC | PRN

## 2019-06-29 NOTE — Discharge Instructions (Signed)
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  We are just prescribed ibuprofen 800.  Take this 4 times a day and eat food with it as it does not upset your stomach.  Use the oxycodone for breakthrough pain.  Do not drive or work while on this.  We treated you prophylactically for STDs.  These are pending.  Return to the ER if he develop fevers, redness that stayed over the joint that limits her range of motion or any other concerns

## 2019-09-14 ENCOUNTER — Emergency Department
Admission: EM | Admit: 2019-09-14 | Discharge: 2019-09-14 | Disposition: A | Discharge status: Home / Self Care | Payer: Self-pay | Attending: Student | Admitting: Student

## 2019-09-14 ENCOUNTER — Encounter: Payer: Self-pay | Admitting: Intensive Care

## 2019-09-14 ENCOUNTER — Other Ambulatory Visit: Payer: Self-pay

## 2019-09-14 DIAGNOSIS — M5441 Lumbago with sciatica, right side: Secondary | ICD-10-CM | POA: Insufficient documentation

## 2019-09-14 DIAGNOSIS — Z79899 Other long term (current) drug therapy: Secondary | ICD-10-CM | POA: Insufficient documentation

## 2019-09-14 DIAGNOSIS — Z87891 Personal history of nicotine dependence: Secondary | ICD-10-CM | POA: Insufficient documentation

## 2019-09-14 LAB — URINALYSIS, COMPLETE (UACMP) WITH MICROSCOPIC
Bilirubin Urine: NEGATIVE
Glucose, UA: NEGATIVE mg/dL
Hgb urine dipstick: NEGATIVE
Ketones, ur: NEGATIVE mg/dL
Nitrite: NEGATIVE
Protein, ur: NEGATIVE mg/dL
Specific Gravity, Urine: 1.023 (ref 1.005–1.030)
pH: 7 (ref 5.0–8.0)

## 2019-09-14 MED ORDER — KETOROLAC TROMETHAMINE 30 MG/ML IJ SOLN
30.0000 mg | Freq: Once | INTRAMUSCULAR | Status: AC
  Administered 2019-09-14: 30 mg via INTRAMUSCULAR
  Filled 2019-09-14: qty 1

## 2019-09-14 MED ORDER — PREDNISONE 10 MG (21) PO TBPK: ORAL_TABLET | ORAL | 0 refills | Status: DC

## 2019-09-14 MED ORDER — METHOCARBAMOL 500 MG PO TABS
1000.0000 mg | ORAL_TABLET | Freq: Once | ORAL | Status: AC
  Administered 2019-09-14: 1000 mg via ORAL
  Filled 2019-09-14: qty 2

## 2019-09-14 MED ORDER — HYDROCODONE-ACETAMINOPHEN 5-325 MG PO TABS
1.0000 | ORAL_TABLET | Freq: Once | ORAL | Status: AC
  Administered 2019-09-14: 1 via ORAL
  Filled 2019-09-14: qty 1

## 2019-09-14 MED ORDER — ONDANSETRON 4 MG PO TBDP
4.0000 mg | ORAL_TABLET | Freq: Once | ORAL | Status: AC
  Administered 2019-09-14: 4 mg via ORAL
  Filled 2019-09-14: qty 1

## 2019-09-14 MED ORDER — METHOCARBAMOL 500 MG PO TABS: 500.0000 mg | ORAL_TABLET | Freq: Three times a day (TID) | ORAL | 0 refills | Status: AC | PRN

## 2019-09-14 NOTE — ED Notes (Signed)
Pt here for lower, right sided back pain. Pt in tears and brought back in wheelchair.  Pt had difficultly getting out of wheelchair and onto the bed.  Pain 10/10. Pt stated the pain began a week ago. Advil not helping pain.

## 2019-09-14 NOTE — ED Notes (Signed)
Pt up out of bed &  ambulated to restroom without assistance

## 2019-09-14 NOTE — ED Triage Notes (Signed)
Patient reports awakening X 1 1/2 weeks ago with back pain. Denies injury/trauma.

## 2019-09-14 NOTE — ED Provider Notes (Signed)
Emergency Department Provider Note  ____________________________________________  Time seen: Approximately 9:38 PM  I have reviewed the triage vital signs and the nursing notes.   HISTORY  Chief Complaint Back Pain   Historian Patient     HPI Carolyn Nunez is a 34 y.o. female presents to the ED with bilateral black pain with right lower extremity radiculopathy for the past week. No dysuria, hematuria or increased urinary frequency. No falls or mechanisms of trauma.  Patient denies weakness of the lower extremities or bowel or bladder incontinence.  Denies any history of low back pain.  Denies possibility pregnancy.  No fever or chills at home.  No other alleviating measures have been attempted.   History reviewed. No pertinent past medical history.   Immunizations up to date:  Yes.     History reviewed. No pertinent past medical history.  Patient Active Problem List   Diagnosis Date Noted  . Abdominal pain 09/17/2015    History reviewed. No pertinent surgical history.  Prior to Admission medications   Medication Sig Start Date End Date Taking? Authorizing Provider  albuterol (PROVENTIL HFA;VENTOLIN HFA) 108 (90 Base) MCG/ACT inhaler Inhale 2 puffs into the lungs every 4 (four) hours as needed for wheezing or shortness of breath. 03/09/18   Cuthriell, Delorise Royals, PA-C  methocarbamol (ROBAXIN) 500 MG tablet Take 1 tablet (500 mg total) by mouth every 8 (eight) hours as needed for up to 5 days. 09/14/19 09/19/19  Orvil Feil, PA-C  Omeprazole 20 MG TBEC Take 2 tablets (40 mg total) by mouth 2 (two) times daily. 09/18/15   Delfino Lovett, MD  predniSONE (STERAPRED UNI-PAK 21 TAB) 10 MG (21) TBPK tablet Take 6 tablets the first day, take 5 tablets the second day, take 4 tablets the third day, take 3 tablets the fourth day, take 2 tablets the fifth day, take 1 tablet the sixth day. 09/14/19   Orvil Feil, PA-C  traMADol (ULTRAM) 50 MG tablet Take 1 tablet (50 mg total) by  mouth 2 (two) times daily. 12/07/18   Menshew, Charlesetta Ivory, PA-C    Allergies Patient has no known allergies.  History reviewed. No pertinent family history.  Social History Social History   Tobacco Use  . Smoking status: Former Smoker    Packs/day: 0.50    Types: Cigarettes  . Smokeless tobacco: Never Used  Substance Use Topics  . Alcohol use: Yes    Alcohol/week: 0.0 standard drinks    Comment: occ  . Drug use: Yes    Types: Marijuana     Review of Systems  Constitutional: No fever/chills Eyes:  No discharge ENT: No upper respiratory complaints. Respiratory: no cough. No SOB/ use of accessory muscles to breath Gastrointestinal:   No nausea, no vomiting.  No diarrhea.  No constipation. Musculoskeletal: Patient has paraspinal muscle tenderness along the lumbar spine.  Skin: Negative for rash, abrasions, lacerations, ecchymosis.    ____________________________________________   PHYSICAL EXAM:  VITAL SIGNS: ED Triage Vitals  Enc Vitals Group     BP 09/14/19 1733 139/83     Pulse Rate 09/14/19 1733 74     Resp 09/14/19 1733 16     Temp 09/14/19 1733 98.6 F (37 C)     Temp Source 09/14/19 1733 Oral     SpO2 09/14/19 1733 98 %     Weight 09/14/19 1730 175 lb (79.4 kg)     Height 09/14/19 1730 5\' 2"  (1.575 m)     Head Circumference --  Peak Flow --      Pain Score 09/14/19 1730 10     Pain Loc --      Pain Edu? --      Excl. in [place]? --      Constitutional: Alert and oriented. Well appearing and in no acute distress. Eyes: Conjunctivae are normal. PERRL. EOMI. Head: Atraumatic. Cardiovascular: Normal rate, regular rhythm. Normal S1 and S2.  Good peripheral circulation. Respiratory: Normal respiratory effort without tachypnea or retractions. Lungs CTAB. Good air entry to the bases with no decreased or absent breath sounds Gastrointestinal: Bowel sounds x 4 quadrants. Soft and nontender to palpation. No guarding or rigidity. No  distention. Musculoskeletal: Full range of motion to all extremities. No obvious deformities noted. Patient has paraspinal muscle tenderness along the lumbar spine.  Neurologic:  Normal for age. No gross focal neurologic deficits are appreciated.  Skin:  Skin is warm, dry and intact. No rash noted. Psychiatric: Mood and affect are normal for age. Speech and behavior are normal.   ____________________________________________   LABS (all labs ordered are listed, but only abnormal results are displayed)  Labs Reviewed  URINALYSIS, COMPLETE (UACMP) WITH MICROSCOPIC - Abnormal; Notable for the following components:      Result Value   Color, Urine YELLOW (*)    APPearance HAZY (*)    Leukocytes,Ua TRACE (*)    Bacteria, UA RARE (*)    All other components within normal limits  POC URINE PREG, ED   ____________________________________________  EKG   ____________________________________________  RADIOLOGY   No results found.  ____________________________________________    PROCEDURES  Procedure(s) performed:     Procedures     Medications  ketorolac (TORADOL) 30 MG/ML injection 30 mg (30 mg Intramuscular Given 09/14/19 2015)  methocarbamol (ROBAXIN) tablet 1,000 mg (1,000 mg Oral Given 09/14/19 2015)  HYDROcodone-acetaminophen (NORCO/VICODIN) 5-325 MG per tablet 1 tablet (1 tablet Oral Given 09/14/19 2105)  ondansetron (ZOFRAN-ODT) disintegrating tablet 4 mg (4 mg Oral Given 09/14/19 2106)     ____________________________________________   INITIAL IMPRESSION / ASSESSMENT AND PLAN / ED COURSE  Pertinent labs & imaging results that were available during my care of the patient were reviewed by me and considered in my medical decision making (see chart for details).      Assessment and plan Low back pain 34 year old female presents to the emergency department with low back pain for the past week.  Vital signs are reassuring at triage.  On physical exam, patient  had some paraspinal muscle tenderness along the lumbar spine.  Differential diagnosis included cystitis, pregnancy, lumbar strain, muscle spasm...  Urinalysis was noncontributory for cystitis.  Urine pregnancy testing was negative.  Patient reported that her pain improved after Robaxin, Norco and Toradol were administered.  She was discharged with prednisone and Robaxin.  She was advised to follow-up with primary care as needed.  ____________________________________________  FINAL CLINICAL IMPRESSION(S) / ED DIAGNOSES  Final diagnoses:  Acute bilateral low back pain with right-sided sciatica      NEW MEDICATIONS STARTED DURING THIS VISIT:  ED Discharge Orders         Ordered    predniSONE (STERAPRED UNI-PAK 21 TAB) 10 MG (21) TBPK tablet     09/14/19 2130    methocarbamol (ROBAXIN) 500 MG tablet  Every 8 hours PRN     09/14/19 2130              This chart was dictated using voice recognition software/Dragon. Despite best efforts to proofread, errors  can occur which can change the meaning. Any change was purely unintentional.     Lannie Fields, PA-C 09/14/19 2201    Lilia Pro., MD 09/15/19 [phone]

## 2020-01-07 ENCOUNTER — Encounter: Payer: Self-pay | Admitting: Emergency Medicine

## 2020-01-07 ENCOUNTER — Emergency Department
Admission: EM | Admit: 2020-01-07 | Discharge: 2020-01-07 | Disposition: A | Discharge status: Home / Self Care | Payer: Self-pay | Attending: Emergency Medicine | Admitting: Emergency Medicine

## 2020-01-07 ENCOUNTER — Emergency Department: Payer: Self-pay

## 2020-01-07 ENCOUNTER — Other Ambulatory Visit: Payer: Self-pay

## 2020-01-07 DIAGNOSIS — Z87891 Personal history of nicotine dependence: Secondary | ICD-10-CM | POA: Insufficient documentation

## 2020-01-07 DIAGNOSIS — Z79899 Other long term (current) drug therapy: Secondary | ICD-10-CM | POA: Insufficient documentation

## 2020-01-07 DIAGNOSIS — M79672 Pain in left foot: Secondary | ICD-10-CM | POA: Insufficient documentation

## 2020-01-07 LAB — POCT PREGNANCY, URINE: Preg Test, Ur: NEGATIVE

## 2020-01-07 MED ORDER — TRAMADOL HCL 50 MG PO TABS: 50.0000 mg | ORAL_TABLET | Freq: Three times a day (TID) | ORAL | 0 refills | Status: DC | PRN

## 2020-01-07 MED ORDER — TRAMADOL HCL 50 MG PO TABS
50.0000 mg | ORAL_TABLET | Freq: Once | ORAL | Status: AC
  Administered 2020-01-07: 50 mg via ORAL
  Filled 2020-01-07: qty 1

## 2020-01-07 NOTE — Discharge Instructions (Addendum)
You were seen today for foot pain.  Your x-ray was negative.  You have been provided with an Ace wrap and a postop shoe for comfort.  I have given you prescription for tramadol to take every 8 hours as needed for moderate to severe pain.  Follow-up as needed or if pain persist or worsens.

## 2020-01-07 NOTE — ED Provider Notes (Signed)
[facility] Emergency Department Provider Note ____________________________________________  Time seen: 1240  I have reviewed the triage vital signs and the nursing notes.  HISTORY  Chief Complaint  Foot Pain   HPI Carolyn Nunez is a 34 y.o. female presents to the ER today with complaint of left foot pain.  She reports this started 1 year ago.  The pain has been intermittent.  She reports it flared up 3 days ago.  She describes the pain as sore, achy, sharp and stabbing.  The pain does not radiate.  The pain is worse with ambulation.  She denies numbness, tingling or weakness of the left lower extremity.  She denies any injury to the area.  She has taken Methocarbamol as needed with minimal relief of symptoms.  History reviewed. No pertinent past medical history.  Patient Active Problem List   Diagnosis Date Noted  . Abdominal pain 09/17/2015    History reviewed. No pertinent surgical history.  Prior to Admission medications   Medication Sig Start Date End Date Taking? Authorizing Provider  albuterol (PROVENTIL HFA;VENTOLIN HFA) 108 (90 Base) MCG/ACT inhaler Inhale 2 puffs into the lungs every 4 (four) hours as needed for wheezing or shortness of breath. 03/09/18   Cuthriell, Delorise Royals, PA-C  Omeprazole 20 MG TBEC Take 2 tablets (40 mg total) by mouth 2 (two) times daily. 09/18/15   Delfino Lovett, MD  traMADol (ULTRAM) 50 MG tablet Take 1 tablet (50 mg total) by mouth every 8 (eight) hours as needed. 01/07/20   Lorre Munroe, NP    Allergies Patient has no known allergies.  No family history on file.  Social History Social History   Tobacco Use  . Smoking status: Former Smoker    Packs/day: 0.50    Types: Cigarettes  . Smokeless tobacco: Never Used  Substance Use Topics  . Alcohol use: Yes    Alcohol/week: 0.0 standard drinks    Comment: occ  . Drug use: Yes    Types: Marijuana    Review of Systems  Constitutional: Negative for  fever. Cardiovascular: Negative for chest pain or chest tightness. Respiratory: Negative for cough or shortness of breath. Musculoskeletal: Positive for left foot pain.  Negative for back, hip or knee pain. Skin: Negative for redness or warmth. Neurological: Negative for focal weakness, tingling or numbness. ____________________________________________  PHYSICAL EXAM:  VITAL SIGNS: ED Triage Vitals  Enc Vitals Group     BP 01/07/20 1149 113/72     Pulse Rate 01/07/20 1148 79     Resp 01/07/20 1148 20     Temp 01/07/20 1148 99 F (37.2 C)     Temp Source 01/07/20 1148 Oral     SpO2 01/07/20 1148 98 %     Weight 01/07/20 1147 216 lb (98 kg)     Height 01/07/20 1147 5\' 2"  (1.575 m)     Head Circumference --      Peak Flow --      Pain Score 01/07/20 1147 7     Pain Loc --      Pain Edu? --      Excl. in GC? --     Constitutional: Alert and oriented.  Obese, in no distress. Cardiovascular: Normal rate, regular rhythm.  Pedal pulses 2+ bilaterally Respiratory: Normal respiratory effort. No wheezes/rales/rhonchi. Musculoskeletal: Normal flexion, extension and rotation of the left ankle.  No joint swelling noted.  Generalized pain with palpation over the first through fifth metatarsals.  Strength 5/5 BLE.  Limping  gait. Neurologic:  Normal speech and language. No gross focal neurologic deficits are appreciated. Skin:  Skin is warm, dry and intact. No redness or warmth noted. ____________________________________________   LABS  __________________________________   RADIOLOGY   Imaging Orders     DG Foot Complete Left IMPRESSION:  Negative.    ____________________________________________    INITIAL IMPRESSION / ASSESSMENT AND PLAN / ED COURSE  Left Foot Pain:  Xray negative Tramadol 50 mg PO x 1 She is requesting ACE wrap and post op shoe for comfort She has Meloxicam at home to take as needed RX for Tramadol 50 mg TID prn     I reviewed the patient's  prescription history over the last 12 months in the multi-state controlled substances database(s) that includes [place], [place], [place], [place], [place], [place], [place], [place], [place], [place], [place], [place], [place], and [place].  Results were notable for Oxycodone 5 mg #12, 12.2020 ____________________________________________  FINAL CLINICAL IMPRESSION(S) / ED DIAGNOSES  Final diagnoses:  Foot pain, left      Jearld Fenton, NP 01/07/20 1342    Earleen Newport, MD 01/07/20 1415

## 2020-01-07 NOTE — ED Triage Notes (Signed)
Pt presents to ED via POV with c/o L foot pain intermittent x 1 yr. 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 Pt states pain started again this past Thursday.

## 2020-04-03 ENCOUNTER — Emergency Department
Admission: EM | Admit: 2020-04-03 | Discharge: 2020-04-03 | Disposition: A | Discharge status: Home / Self Care | Payer: Self-pay | Attending: Emergency Medicine | Admitting: Emergency Medicine

## 2020-04-03 ENCOUNTER — Other Ambulatory Visit: Payer: Self-pay

## 2020-04-03 ENCOUNTER — Encounter: Payer: Self-pay | Admitting: Emergency Medicine

## 2020-04-03 DIAGNOSIS — J0100 Acute maxillary sinusitis, unspecified: Secondary | ICD-10-CM | POA: Insufficient documentation

## 2020-04-03 DIAGNOSIS — Z87891 Personal history of nicotine dependence: Secondary | ICD-10-CM | POA: Insufficient documentation

## 2020-04-03 DIAGNOSIS — Z79899 Other long term (current) drug therapy: Secondary | ICD-10-CM | POA: Insufficient documentation

## 2020-04-03 DIAGNOSIS — Z20822 Contact with and (suspected) exposure to covid-19: Secondary | ICD-10-CM | POA: Insufficient documentation

## 2020-04-03 LAB — SARS CORONAVIRUS 2 BY RT PCR (HOSPITAL ORDER, PERFORMED IN ~~LOC~~ HOSPITAL LAB): SARS Coronavirus 2: NEGATIVE

## 2020-04-03 MED ORDER — FLUTICASONE PROPIONATE 50 MCG/ACT NA SUSP: 2.0000 | Freq: Every day | NASAL | 2 refills | Status: DC

## 2020-04-03 MED ORDER — AMOXICILLIN 500 MG PO CAPS: 500.0000 mg | ORAL_CAPSULE | Freq: Three times a day (TID) | ORAL | 0 refills | Status: DC

## 2020-04-03 NOTE — ED Triage Notes (Signed)
Presents with sinus pressure for the past 2 weeks  Denies any fever,n/v or body aches   Min relief with Sudafed

## 2020-04-03 NOTE — ED Provider Notes (Signed)
[facility] Emergency Department Provider Note  ____________________________________________   First MD Initiated Contact with Patient 04/03/20 1233     (approximate)  I have reviewed the triage vital signs and the nursing notes.   HISTORY  Chief Complaint Facial Pain    HPI CARIANA KARGE is a 34 y.o. female presents emergency department with sinus pressure for the past 2 weeks.  Complaining of yellow mucus and headache.  Patient is fully vaccinated.  She denies any fever or chills.  Has had a congested cough.    History reviewed. No pertinent past medical history.  Patient Active Problem List   Diagnosis Date Noted  . Abdominal pain 09/17/2015    History reviewed. No pertinent surgical history.  Prior to Admission medications   Medication Sig Start Date End Date Taking? Authorizing Provider  albuterol (PROVENTIL HFA;VENTOLIN HFA) 108 (90 Base) MCG/ACT inhaler Inhale 2 puffs into the lungs every 4 (four) hours as needed for wheezing or shortness of breath. 03/09/18   Cuthriell, Delorise Royals, PA-C  amoxicillin (AMOXIL) 500 MG capsule Take 1 capsule (500 mg total) by mouth 3 (three) times daily. 04/03/20   Elyshia Kumagai, Roselyn Bering, PA-C  fluticasone (FLONASE) 50 MCG/ACT nasal spray Place 2 sprays into both nostrils daily. 04/03/20 04/03/21  Ladislaus Repsher, Roselyn Bering, PA-C  Omeprazole 20 MG TBEC Take 2 tablets (40 mg total) by mouth 2 (two) times daily. 09/18/15   Delfino Lovett, MD    Allergies Patient has no known allergies.  No family history on file.  Social History Social History   Tobacco Use  . Smoking status: Former Smoker    Packs/day: 0.50    Types: Cigarettes  . Smokeless tobacco: Never Used  Substance Use Topics  . Alcohol use: Yes    Alcohol/week: 0.0 standard drinks    Comment: occ  . Drug use: Yes    Types: Marijuana    Review of Systems  Constitutional: No fever/chills Eyes: No visual changes. ENT: No sore throat.  Positive for  congestion Respiratory: Positive cough Cardiovascular: Denies chest pain Gastrointestinal: Denies abdominal pain Genitourinary: Negative for dysuria. Musculoskeletal: Negative for back pain. Skin: Negative for rash. Psychiatric: no mood changes,     ____________________________________________   PHYSICAL EXAM:  VITAL SIGNS: ED Triage Vitals [04/03/20 1238]  Enc Vitals Group     BP 125/76     Pulse Rate 61     Resp 18     Temp 98.5 F (36.9 C)     Temp Source Oral     SpO2 100 %     Weight 214 lb (97.1 kg)     Height 5\' 2"  (1.575 m)     Head Circumference      Peak Flow      Pain Score 0     Pain Loc      Pain Edu?      Excl. in GC?     Constitutional: Alert and oriented. Well appearing and in no acute distress. Eyes: Conjunctivae are normal.  Head: Atraumatic. Nose: No congestion/rhinnorhea. Mouth/Throat: Mucous membranes are moist.  Throat appears normal Neck:  supple no lymphadenopathy noted Cardiovascular: Normal rate, regular rhythm. Heart sounds are normal Respiratory: Normal respiratory effort.  No retractions, lungs c t a  GU: deferred Musculoskeletal: FROM all extremities, warm and well perfused Neurologic:  Normal speech and language.  Skin:  Skin is warm, dry and intact. No rash noted. Psychiatric: Mood and affect are normal. Speech and behavior are normal.  ____________________________________________   LABS (all labs ordered are listed, but only abnormal results are displayed)  Labs Reviewed  SARS CORONAVIRUS 2 BY RT PCR (HOSPITAL ORDER, PERFORMED IN [facility])   ____________________________________________   ____________________________________________  RADIOLOGY    ____________________________________________   PROCEDURES  Procedure(s) performed: No  Procedures    ____________________________________________   INITIAL IMPRESSION / ASSESSMENT AND PLAN / ED COURSE  Pertinent labs & imaging results that were  available during my care of the patient were reviewed by me and considered in my medical decision making (see chart for details).   Patient is a 34 year old Covid vaccinated patient presents with URI symptoms.  See HPI.  Physical exam is unremarkable.  Covid test, patient also be treated for sinusitis.  Did explain to her that being vaccinated she may seem like she has a cold when she is actually positive for Covid.  She states she understands.  If she is positive she is to quarantine for 10 to 14 days.  She states she understands.  She discharged stable condition.     TANGALA WIEGERT was evaluated in Emergency Department on 04/03/2020 for the symptoms described in the history of present illness. She was evaluated in the context of the global COVID-19 pandemic, which necessitated consideration that the patient might be at risk for infection with the SARS-CoV-2 virus that causes COVID-19. Institutional protocols and algorithms that pertain to the evaluation of patients at risk for COVID-19 are in a state of rapid change based on information released by regulatory bodies including the CDC and federal and state organizations. These policies and algorithms were followed during the patient's care in the ED.    As part of my medical decision making, I reviewed the following data within the electronic MEDICAL RECORD NUMBER Nursing notes reviewed and incorporated, Labs reviewed , Old chart reviewed, Notes from prior ED visits and [place] Controlled Substance Database  ____________________________________________   FINAL CLINICAL IMPRESSION(S) / ED DIAGNOSES  Final diagnoses:  Acute maxillary sinusitis, recurrence not specified  Suspected COVID-19 virus infection      NEW MEDICATIONS STARTED DURING THIS VISIT:  New Prescriptions   AMOXICILLIN (AMOXIL) 500 MG CAPSULE    Take 1 capsule (500 mg total) by mouth 3 (three) times daily.   FLUTICASONE (FLONASE) 50 MCG/ACT NASAL SPRAY    Place 2 sprays into both  nostrils daily.     Note:  This document was prepared using Dragon voice recognition software and may include unintentional dictation errors.    Faythe Ghee, PA-C 04/03/20 1533    Sharman Cheek, MD 04/04/20 2155

## 2020-04-03 NOTE — Discharge Instructions (Signed)
Follow-up with your regular doctor in acute care if not improving in 3 to 5 days.  Take medication as prescribed.  Return emergency department worsening.  Covid test will result in 2 to 3 hours.  If positive you will get a phone call.  If negative they will not call you.  If you have [place] MyChart you will be able to see the result yourself

## 2020-04-03 NOTE — ED Notes (Signed)
Pt alert and oriented X 4, stable for discharge. RR even and unlabored, color WNL. Discussed discharge instructions and follow-up as directed. Discharge medications discussed if provided. Pt had opportunity to ask questions if necessary and RN to provide patient/family eduction.  

## 2021-05-10 ENCOUNTER — Inpatient Hospital Stay
Admission: EM | Admit: 2021-05-10 | Discharge: 2021-05-13 | DRG: 603 | Disposition: A | Discharge status: Home / Self Care | Payer: Medicaid Other | Attending: Internal Medicine | Admitting: Internal Medicine

## 2021-05-10 ENCOUNTER — Emergency Department: Payer: Medicaid Other

## 2021-05-10 DIAGNOSIS — M65142 Other infective (teno)synovitis, left hand: Secondary | ICD-10-CM | POA: Diagnosis present

## 2021-05-10 DIAGNOSIS — M659 Synovitis and tenosynovitis, unspecified: Secondary | ICD-10-CM

## 2021-05-10 DIAGNOSIS — L03012 Cellulitis of left finger: Principal | ICD-10-CM | POA: Diagnosis present

## 2021-05-10 DIAGNOSIS — S60372A Other superficial bite of left thumb, initial encounter: Secondary | ICD-10-CM | POA: Diagnosis present

## 2021-05-10 DIAGNOSIS — M65949 Unspecified synovitis and tenosynovitis, unspecified hand: Secondary | ICD-10-CM

## 2021-05-10 DIAGNOSIS — Z6837 Body mass index (BMI) 37.0-37.9, adult: Secondary | ICD-10-CM

## 2021-05-10 DIAGNOSIS — L0390 Cellulitis, unspecified: Secondary | ICD-10-CM | POA: Diagnosis present

## 2021-05-10 DIAGNOSIS — Z20822 Contact with and (suspected) exposure to covid-19: Secondary | ICD-10-CM | POA: Diagnosis present

## 2021-05-10 DIAGNOSIS — E669 Obesity, unspecified: Secondary | ICD-10-CM | POA: Diagnosis present

## 2021-05-10 LAB — COMPREHENSIVE METABOLIC PANEL
ALT: 16 U/L (ref 0–44)
AST: 27 U/L (ref 15–41)
Albumin: 3.9 g/dL (ref 3.5–5.0)
Alkaline Phosphatase: 68 U/L (ref 38–126)
Anion gap: 5 (ref 5–15)
BUN: 9 mg/dL (ref 6–20)
CO2: 27 mmol/L (ref 22–32)
Calcium: 8.7 mg/dL — ABNORMAL LOW (ref 8.9–10.3)
Chloride: 106 mmol/L (ref 98–111)
Creatinine, Ser: 1.18 mg/dL — ABNORMAL HIGH (ref 0.44–1.00)
GFR, Estimated: >60 mL/min (ref >60)
Glucose, Bld: 120 mg/dL — ABNORMAL HIGH (ref 70–99)
Potassium: 4 mmol/L (ref 3.5–5.1)
Sodium: 138 mmol/L (ref 135–145)
Total Bilirubin: 0.4 mg/dL (ref 0.3–1.2)
Total Protein: 6.9 g/dL (ref 6.5–8.1)

## 2021-05-10 LAB — CBC WITH DIFFERENTIAL/PLATELET
Abs Immature Granulocytes: 0.02 10*3/uL (ref 0.00–0.07)
Basophils Absolute: 0.1 10*3/uL (ref 0.0–0.1)
Basophils Relative: 1 %
Eosinophils Absolute: 0.4 10*3/uL (ref 0.0–0.5)
Eosinophils Relative: 4 %
HCT: 39.8 % (ref 36.0–46.0)
Hemoglobin: 13 g/dL (ref 12.0–15.0)
Immature Granulocytes: 0 %
Lymphocytes Relative: 39 %
Lymphs Abs: 3.5 10*3/uL (ref 0.7–4.0)
MCH: 26.6 pg (ref 26.0–34.0)
MCHC: 32.7 g/dL (ref 30.0–36.0)
MCV: 81.6 fL (ref 80.0–100.0)
Monocytes Absolute: 0.6 10*3/uL (ref 0.1–1.0)
Monocytes Relative: 6 %
Neutro Abs: 4.4 10*3/uL (ref 1.7–7.7)
Neutrophils Relative %: 50 %
Platelets: 273 10*3/uL (ref 150–400)
RBC: 4.88 MIL/uL (ref 3.87–5.11)
RDW: 15.3 % (ref 11.5–15.5)
WBC: 8.9 10*3/uL (ref 4.0–10.5)
nRBC: 0 % (ref 0.0–0.2)

## 2021-05-10 LAB — RESP PANEL BY RT-PCR (FLU A&B, COVID) ARPGX2
Influenza A by PCR: NEGATIVE
Influenza B by PCR: NEGATIVE
SARS Coronavirus 2 by RT PCR: NEGATIVE

## 2021-05-10 MED ORDER — ONDANSETRON 4 MG PO TBDP
4.0000 mg | ORAL_TABLET | Freq: Once | ORAL | Status: AC
  Administered 2021-05-10: 4 mg via ORAL
  Filled 2021-05-10: qty 1

## 2021-05-10 MED ORDER — VANCOMYCIN HCL IN DEXTROSE 1-5 GM/200ML-% IV SOLN
1000.0000 mg | Freq: Once | INTRAVENOUS | Status: AC
  Administered 2021-05-10: 1000 mg via INTRAVENOUS
  Filled 2021-05-10: qty 200

## 2021-05-10 MED ORDER — SODIUM CHLORIDE 0.9 % IV SOLN
1.5000 g | Freq: Once | INTRAVENOUS | Status: AC
  Administered 2021-05-10: 1.5 g via INTRAVENOUS
  Filled 2021-05-10: qty 4

## 2021-05-10 MED ORDER — OXYCODONE-ACETAMINOPHEN 5-325 MG PO TABS
1.0000 | ORAL_TABLET | Freq: Once | ORAL | Status: AC
  Administered 2021-05-10: 1 via ORAL
  Filled 2021-05-10: qty 1
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
## 2021-05-11 ENCOUNTER — Other Ambulatory Visit: Payer: Self-pay

## 2021-05-11 DIAGNOSIS — E6609 Other obesity due to excess calories: Secondary | ICD-10-CM

## 2021-05-11 DIAGNOSIS — L03012 Cellulitis of left finger: Principal | ICD-10-CM

## 2021-05-11 DIAGNOSIS — M659 Synovitis and tenosynovitis, unspecified: Secondary | ICD-10-CM

## 2021-05-11 DIAGNOSIS — Z6832 Body mass index (BMI) 32.0-32.9, adult: Secondary | ICD-10-CM

## 2021-05-11 DIAGNOSIS — E669 Obesity, unspecified: Secondary | ICD-10-CM | POA: Diagnosis present

## 2021-05-11 LAB — COMPREHENSIVE METABOLIC PANEL
ALT: 13 U/L (ref 0–44)
AST: 23 U/L (ref 15–41)
Albumin: 3.2 g/dL — ABNORMAL LOW (ref 3.5–5.0)
Alkaline Phosphatase: 47 U/L (ref 38–126)
Anion gap: 6 (ref 5–15)
BUN: 7 mg/dL (ref 6–20)
CO2: 27 mmol/L (ref 22–32)
Calcium: 8.2 mg/dL — ABNORMAL LOW (ref 8.9–10.3)
Chloride: 108 mmol/L (ref 98–111)
Creatinine, Ser: 1.03 mg/dL — ABNORMAL HIGH (ref 0.44–1.00)
GFR, Estimated: >60 mL/min (ref >60)
Glucose, Bld: 92 mg/dL (ref 70–99)
Potassium: 3.8 mmol/L (ref 3.5–5.1)
Sodium: 141 mmol/L (ref 135–145)
Total Bilirubin: 0.5 mg/dL (ref 0.3–1.2)
Total Protein: 6 g/dL — ABNORMAL LOW (ref 6.5–8.1)

## 2021-05-11 LAB — CBC
HCT: 36.5 % (ref 36.0–46.0)
Hemoglobin: 12.2 g/dL (ref 12.0–15.0)
MCH: 27.8 pg (ref 26.0–34.0)
MCHC: 33.4 g/dL (ref 30.0–36.0)
MCV: 83.1 fL (ref 80.0–100.0)
Platelets: 226 10*3/uL (ref 150–400)
RBC: 4.39 MIL/uL (ref 3.87–5.11)
RDW: 15.1 % (ref 11.5–15.5)
WBC: 7.6 10*3/uL (ref 4.0–10.5)
nRBC: 0 % (ref 0.0–0.2)

## 2021-05-11 LAB — HIV ANTIBODY (ROUTINE TESTING W REFLEX): HIV Screen 4th Generation wRfx: NONREACTIVE

## 2021-05-11 MED ORDER — SODIUM CHLORIDE 0.9 % IV SOLN
3.0000 g | Freq: Four times a day (QID) | INTRAVENOUS | Status: DC
  Administered 2021-05-11 – 2021-05-13 (×10): 3 g via INTRAVENOUS
  Filled 2021-05-11: qty 8
  Filled 2021-05-11: qty 3
  Filled 2021-05-11 (×3): qty 8
  Filled 2021-05-11: qty 3
  Filled 2021-05-11: qty 8
  Filled 2021-05-11: qty 3
  Filled 2021-05-11 (×4): qty 8

## 2021-05-11 MED ORDER — SODIUM CHLORIDE 0.9 % IV SOLN
1.5000 g | Freq: Once | INTRAVENOUS | Status: AC
  Administered 2021-05-11: 1.5 g via INTRAVENOUS
  Filled 2021-05-11: qty 4

## 2021-05-11 MED ORDER — MORPHINE SULFATE (PF) 2 MG/ML IV SOLN
2.0000 mg | INTRAVENOUS | Status: DC | PRN
  Administered 2021-05-11 (×3): 2 mg via INTRAVENOUS
  Filled 2021-05-11 (×3): qty 1

## 2021-05-11 MED ORDER — ACETAMINOPHEN 325 MG PO TABS: 650.0000 mg | ORAL_TABLET | Freq: Four times a day (QID) | ORAL | Status: DC | PRN

## 2021-05-11 MED ORDER — ENOXAPARIN SODIUM 60 MG/0.6ML IJ SOSY
0.5000 mg/kg | PREFILLED_SYRINGE | INTRAMUSCULAR | Status: DC
  Administered 2021-05-11 – 2021-05-13 (×3): 47.5 mg via SUBCUTANEOUS
  Filled 2021-05-11 (×3): qty 0.6

## 2021-05-11 MED ORDER — ACETAMINOPHEN 650 MG RE SUPP: 650.0000 mg | Freq: Four times a day (QID) | RECTAL | Status: DC | PRN

## 2021-05-11 MED ORDER — ONDANSETRON HCL 4 MG PO TABS: 4.0000 mg | ORAL_TABLET | Freq: Four times a day (QID) | ORAL | Status: DC | PRN

## 2021-05-11 MED ORDER — HYDROCODONE-ACETAMINOPHEN 5-325 MG PO TABS
1.0000 | ORAL_TABLET | Freq: Four times a day (QID) | ORAL | Status: DC | PRN
  Administered 2021-05-11 – 2021-05-13 (×8): 1 via ORAL
  Filled 2021-05-11 (×8): qty 1

## 2021-05-11 MED ORDER — KETOROLAC TROMETHAMINE 30 MG/ML IJ SOLN
30.0000 mg | Freq: Three times a day (TID) | INTRAMUSCULAR | Status: DC | PRN
  Administered 2021-05-11 – 2021-05-13 (×2): 30 mg via INTRAVENOUS
  Filled 2021-05-11 (×2): qty 1

## 2021-05-11 MED ORDER — ONDANSETRON HCL 4 MG/2ML IJ SOLN: 4.0000 mg | Freq: Four times a day (QID) | INTRAMUSCULAR | Status: DC | PRN

## 2021-05-11 NOTE — Progress Notes (Signed)
PHARMACIST - PHYSICIAN COMMUNICATION  CONCERNING:  Enoxaparin (Lovenox) for DVT Prophylaxis    RECOMMENDATION: Patient was prescribed enoxaprin 40mg  q24 hours for VTE prophylaxis.   Filed Weights   05/10/21 2039  Weight: 93.4 kg (206 lb)    Body mass index is 37.68 kg/m.  Estimated Creatinine Clearance: 70.8 mL/min (A) (by C-G formula based on SCr of 1.18 mg/dL (H)).   Based on [facility] policy patient is candidate for enoxaparin 0.5mg /kg TBW SQ every 24 hours based on BMI being >30.  DESCRIPTION: Pharmacy has adjusted enoxaparin dose per [facility] policy.  Patient is now receiving enoxaparin 0.5 mg/kg every 24 hours    [facility], PharmD, [facility] 05/11/2021 12:09 AM
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
## 2021-05-12 MED ORDER — POLYETHYLENE GLYCOL 3350 17 G PO PACK
17.0000 g | PACK | Freq: Every day | ORAL | Status: DC
  Administered 2021-05-12: 17 g via ORAL

## 2021-05-12 MED ORDER — FLEET ENEMA 7-19 GM/118ML RE ENEM: 1.0000 | ENEMA | Freq: Every day | RECTAL | Status: DC | PRN
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
## 2021-05-12 NOTE — Progress Notes (Signed)
  Subjective:  Patient seen at 9 AM this morning.  Patient was lying in bed with an adult female visitor at the bedside.  Patient is complaining of pain in her left thumb.  She states that just recently she developed some radial sided forearm pain.  Objective:   VITALS:   Vitals:   05/11/21 2000 05/12/21 0332 05/12/21 0806 05/12/21 1225  BP: 128/75 134/77 107/75 111/75  Pulse: 60 63 (!) 56 (!) 56  Resp: 18  14 16   Temp: 98.5 F (36.9 C) 98.5 F (36.9 C) 98 F (36.7 C) 97.8 F (36.6 C)  TempSrc:   Oral   SpO2: 100% 100% 99% 100%  Weight:      Height:        PHYSICAL EXAM: Left upper extremity: Patient still has swelling over the volar aspect of the left thumb.  There is no swelling of the rest of her left hand.  There is no swelling of her thenar eminence.  There is no ascending erythema of her left forearm.  Her forearm compartments are soft and compressible.  There is no tenderness to palpation of her forearm.  She still has tenderness however over the volar aspect of her left thumb that the thumb feels less tense to palpation than it did yesterday.  Patient has intact sensation to light touch throughout the 5 digits of the left hand.  Range of motion of the left thumb is limited due to pain and swelling.  Patient's left thumb is well-perfused.   LABS  No results found for this or any previous visit (from the past 24 hour(s)).  DG Hand Complete Left  Result Date: 05/10/2021 CLINICAL DATA:  Left hand pain. EXAM: LEFT HAND - COMPLETE 3+ VIEW COMPARISON:  None. FINDINGS: There is no evidence of fracture or dislocation. There is no evidence of arthropathy or other focal bone abnormality. Soft tissues are unremarkable. IMPRESSION: Negative. Electronically Signed   By: 05/12/2021 M.D.   On: 05/10/2021 20:58    Assessment/Plan:     Principal Problem:   Cellulitis Active Problems:   Obesity   Flexor tenosynovitis of finger  Patient may eat today.  She appears to be  improving slowly on IV antibiotics.  I recommend that we continue another 24 hours of antibiotics.  I will make her n.p.o. after midnight and recheck her in the morning to decide if she needs surgical decompression tomorrow.  If the patient continues to improve she may be ready for discharge on oral antibiotics.    05/12/2021 , MD 05/12/2021, 1:48 PM

## 2021-05-13 ENCOUNTER — Encounter: Payer: Self-pay | Admitting: Internal Medicine

## 2021-05-13 ENCOUNTER — Other Ambulatory Visit: Payer: Self-pay

## 2021-05-13 MED ORDER — HYDROCODONE-ACETAMINOPHEN 5-325 MG PO TABS: 1.0000 | ORAL_TABLET | Freq: Four times a day (QID) | ORAL | 0 refills | Status: DC | PRN

## 2021-05-13 MED ORDER — AMOXICILLIN-POT CLAVULANATE 875-125 MG PO TABS
1.0000 | ORAL_TABLET | Freq: Two times a day (BID) | ORAL | 0 refills | Status: AC
  Filled 2021-05-13: qty 20, 10d supply, fill #0
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
## 2021-05-13 NOTE — TOC Transition Note (Signed)
Transition of Care [facility]) - CM/SW Discharge Note   Patient Details  Name: Carolyn Nunez MRN: [id number] Date of Birth: [birth year]  [facility] CM/SW Contact:  Margarito Liner, LCSW Phone Number: 05/13/2021, 11:30 AM   Clinical Narrative: Patient has orders to discharge home today. No PCP or insurance. Patient unable to afford GoodRx price. MD sent [facility] and [facility]co to [facility]. Patient said her mother will be able to take her to [facility] once she picks her up. Put packet for free and low-cost healthcare in discharge packet. No further concerns. CSW signing off.     Final next level of care: Home/Self Care Barriers to Discharge: No Barriers Identified   Patient Goals and CMS Choice        Discharge Placement                Patient to be transferred to facility by: Mother will take her home.   Patient and family notified of of transfer: 05/13/21  Discharge Plan and Services                                     Social Determinants of Health (SDOH) Interventions     Readmission Risk Interventions No flowsheet data found.

## 2021-08-25 ENCOUNTER — Emergency Department: Payer: Medicaid Other

## 2021-08-25 ENCOUNTER — Other Ambulatory Visit: Payer: Self-pay

## 2021-08-25 ENCOUNTER — Encounter: Payer: Self-pay | Admitting: Emergency Medicine

## 2021-08-25 ENCOUNTER — Emergency Department
Admission: EM | Admit: 2021-08-25 | Discharge: 2021-08-25 | Disposition: A | Discharge status: Home / Self Care | Payer: Medicaid Other | Attending: Emergency Medicine | Admitting: Emergency Medicine

## 2021-08-25 DIAGNOSIS — R1011 Right upper quadrant pain: Secondary | ICD-10-CM | POA: Diagnosis present

## 2021-08-25 DIAGNOSIS — R1010 Upper abdominal pain, unspecified: Secondary | ICD-10-CM

## 2021-08-25 DIAGNOSIS — N83202 Unspecified ovarian cyst, left side: Secondary | ICD-10-CM

## 2021-08-25 DIAGNOSIS — N3000 Acute cystitis without hematuria: Secondary | ICD-10-CM | POA: Insufficient documentation

## 2021-08-25 LAB — URINALYSIS, ROUTINE W REFLEX MICROSCOPIC
Bilirubin Urine: NEGATIVE
Glucose, UA: NEGATIVE mg/dL
Hgb urine dipstick: NEGATIVE
Ketones, ur: NEGATIVE mg/dL
Nitrite: NEGATIVE
Protein, ur: NEGATIVE mg/dL
Specific Gravity, Urine: 1.015 (ref 1.005–1.030)
pH: 8.5 — ABNORMAL HIGH (ref 5.0–8.0)

## 2021-08-25 LAB — LIPASE, BLOOD: Lipase: 35 U/L (ref 11–51)

## 2021-08-25 LAB — CBC
HCT: 41.5 % (ref 36.0–46.0)
Hemoglobin: 13.3 g/dL (ref 12.0–15.0)
MCH: 26.5 pg (ref 26.0–34.0)
MCHC: 32 g/dL (ref 30.0–36.0)
MCV: 82.8 fL (ref 80.0–100.0)
Platelets: 266 10*3/uL (ref 150–400)
RBC: 5.01 MIL/uL (ref 3.87–5.11)
RDW: 16.6 % — ABNORMAL HIGH (ref 11.5–15.5)
WBC: 8.4 10*3/uL (ref 4.0–10.5)
nRBC: 0 % (ref 0.0–0.2)

## 2021-08-25 LAB — COMPREHENSIVE METABOLIC PANEL
ALT: 13 U/L (ref 0–44)
AST: 18 U/L (ref 15–41)
Albumin: 3.4 g/dL — ABNORMAL LOW (ref 3.5–5.0)
Alkaline Phosphatase: 57 U/L (ref 38–126)
Anion gap: 6 (ref 5–15)
BUN: 12 mg/dL (ref 6–20)
CO2: 24 mmol/L (ref 22–32)
Calcium: 8.2 mg/dL — ABNORMAL LOW (ref 8.9–10.3)
Chloride: 105 mmol/L (ref 98–111)
Creatinine, Ser: 1.02 mg/dL — ABNORMAL HIGH (ref 0.44–1.00)
GFR, Estimated: >60 mL/min (ref >60)
Glucose, Bld: 91 mg/dL (ref 70–99)
Potassium: 3.7 mmol/L (ref 3.5–5.1)
Sodium: 135 mmol/L (ref 135–145)
Total Bilirubin: 0.7 mg/dL (ref 0.3–1.2)
Total Protein: 6.5 g/dL (ref 6.5–8.1)

## 2021-08-25 LAB — POC URINE PREG, ED: Preg Test, Ur: NEGATIVE

## 2021-08-25 MED ORDER — ONDANSETRON HCL 4 MG/2ML IJ SOLN
4.0000 mg | Freq: Once | INTRAMUSCULAR | Status: AC
  Administered 2021-08-25: 4 mg via INTRAVENOUS
  Filled 2021-08-25: qty 2

## 2021-08-25 MED ORDER — IOHEXOL 300 MG/ML  SOLN
100.0000 mL | Freq: Once | INTRAMUSCULAR | Status: AC | PRN
  Administered 2021-08-25: 100 mL via INTRAVENOUS
  Filled 2021-08-25: qty 100

## 2021-08-25 MED ORDER — DICYCLOMINE HCL 10 MG PO CAPS: 10.0000 mg | ORAL_CAPSULE | Freq: Three times a day (TID) | ORAL | 0 refills | Status: DC

## 2021-08-25 MED ORDER — CEPHALEXIN 500 MG PO CAPS: 500.0000 mg | ORAL_CAPSULE | Freq: Two times a day (BID) | ORAL | 0 refills | Status: AC

## 2021-08-25 MED ORDER — SODIUM CHLORIDE 0.9 % IV BOLUS
1000.0000 mL | Freq: Once | INTRAVENOUS | Status: AC
  Administered 2021-08-25: 1000 mL via INTRAVENOUS

## 2021-08-25 MED ORDER — MORPHINE SULFATE (PF) 4 MG/ML IV SOLN
4.0000 mg | Freq: Once | INTRAVENOUS | Status: AC
  Administered 2021-08-25: 4 mg via INTRAVENOUS
  Filled 2021-08-25: qty 1

## 2021-08-25 MED ORDER — DICYCLOMINE HCL 10 MG PO CAPS
10.0000 mg | ORAL_CAPSULE | Freq: Once | ORAL | Status: AC
  Administered 2021-08-25: 10 mg via ORAL
  Filled 2021-08-25: qty 1

## 2021-08-25 MED ORDER — FAMOTIDINE IN NACL 20-0.9 MG/50ML-% IV SOLN
20.0000 mg | Freq: Once | INTRAVENOUS | Status: AC
  Administered 2021-08-25: 20 mg via INTRAVENOUS
  Filled 2021-08-25: qty 50

## 2021-08-25 NOTE — ED Notes (Signed)
Patient transported to CT 

## 2021-08-25 NOTE — ED Provider Notes (Signed)
[facility] Provider Note    Event Date/Time   First MD Initiated Contact with Patient 08/25/21 1232     (approximate)   History   Abdominal Pain   HPI  Carolyn Nunez is a 36 y.o. female with history of no chronic medical problems and as listed in EMR presents to the emergency department for treatment and evaluation of epigastric abdominal pain. Symptoms started this morning after eating. Pain is sharp and radiates into the right upper quadrant. She has had one episode of vomiting.  No fever.      Physical Exam   Triage Vital Signs: ED Triage Vitals  Enc Vitals Group     BP 08/25/21 1135 131/89     Pulse Rate 08/25/21 1135 76     Resp 08/25/21 1135 18     Temp 08/25/21 1135 98.7 F (37.1 C)     Temp Source 08/25/21 1135 Oral     SpO2 08/25/21 1135 99 %     Weight 08/25/21 1136 201 lb (91.2 kg)     Height 08/25/21 1136 5\' 3"  (1.6 m)     Head Circumference --      Peak Flow --      Pain Score 08/25/21 1136 10     Pain Loc --      Pain Edu? --      Excl. in [place]? --     Most recent vital signs: Vitals:   08/25/21 1135 08/25/21 1513  BP: 131/89 130/80  Pulse: 76 70  Resp: 18 18  Temp: 98.7 F (37.1 C)   SpO2: 99% 99%    General: Awake, no distress.  CV:  Good peripheral perfusion.  Resp:  Normal effort.  Abd:  No distention. Epigastric tenderness and RUQ tenderness Other:     ED Results / Procedures / Treatments   Labs (all labs ordered are listed, but only abnormal results are displayed) Labs Reviewed  COMPREHENSIVE METABOLIC PANEL - Abnormal; Notable for the following components:      Result Value   Creatinine, Ser 1.02 (*)    Calcium 8.2 (*)    Albumin 3.4 (*)    All other components within normal limits  CBC - Abnormal; Notable for the following components:   RDW 16.6 (*)    All other components within normal limits  URINALYSIS, ROUTINE W REFLEX MICROSCOPIC - Abnormal; Notable for the following components:    APPearance CLEAR (*)    pH 8.5 (*)    Leukocytes,Ua TRACE (*)    Bacteria, UA RARE (*)    All other components within normal limits  LIPASE, BLOOD  POC URINE PREG, ED     EKG     RADIOLOGY  Image and radiology report reviewed by me.  US of the gall bladder negative for acute findings.  PROCEDURES:  Critical Care performed: No  Procedures   MEDICATIONS ORDERED IN ED: Medications  sodium chloride 0.9 % bolus 1,000 mL (0 mLs Intravenous Stopped 08/25/21 1442)  famotidine (PEPCID) IVPB 20 mg premix (0 mg Intravenous Stopped 08/25/21 1442)  morphine (PF) 4 MG/ML injection 4 mg (4 mg Intravenous Given 08/25/21 1344)  ondansetron (ZOFRAN) injection 4 mg (4 mg Intravenous Given 08/25/21 1344)  dicyclomine (BENTYL) capsule 10 mg (10 mg Oral Given 08/25/21 1500)  iohexol (OMNIPAQUE) 300 MG/ML solution 100 mL (100 mLs Intravenous Contrast Given 08/25/21 1504)     IMPRESSION / MDM / ASSESSMENT AND PLAN / ED COURSE   I have reviewed  the triage note.  Differential diagnosis includes, but is not limited to cholecystitis, cholelithiasis, colitis, GERD  36 year old female presenting to the emergency department for treatment and evaluation of abdominal pain.  See HPI for further details.  Ultrasound is negative for acute findings.  Patient says that her abdomen still hurts even after 4 morphine and 4 Zofran.  Bentyl ordered.  CT abdomen and pelvis with contrast also ordered.  Patient aware and agreed to the plan.  CT results reassuring. Urinalysis concerning for cystitis. Will treat with Keflex. If abdominal pain is not improving over the week. She is to follow up with gastroenterology. She is to return to the ER for symptoms that change or worsen.    FINAL CLINICAL IMPRESSION(S) / ED DIAGNOSES   Final diagnoses:  RUQ pain  Acute cystitis without hematuria  Pain of upper abdomen  Cyst of left ovary     Rx / DC Orders   ED Discharge Orders          Ordered    cephALEXin (KEFLEX)  500 MG capsule  2 times daily        08/25/21 1525    dicyclomine (BENTYL) 10 MG capsule  3 times daily before meals & bedtime        08/25/21 1525             Note:  This document was prepared using Dragon voice recognition software and may include unintentional dictation errors.   Victorino Dike, FNP 08/25/21 1527    Harvest Dark, MD 08/26/21 1444

## 2021-08-25 NOTE — ED Notes (Signed)
See triage note  presents iwht upper abd pain which started this am   has had vomiting 3 times this am  no fever  states had similar episode couple of years ago  but did not f/u

## 2021-08-25 NOTE — ED Triage Notes (Signed)
Pt via POV from home. Pt c/o epigastric abd pain that started this AM. Pt also endorses nausea and vomiting. Denies any abd surgeries. Pt is A&OX4 and NAD.

## 2022-11-09 IMAGING — US US ABDOMEN LIMITED
1 series · 14 of 25 positions shown · non-contrast
Comparison: September 17, 2015.

CLINICAL DATA: Acute right upper quadrant abdominal pain.

EXAM:
ULTRASOUND ABDOMEN LIMITED RIGHT UPPER QUADRANT

[Series 1: us abdomen limited ruq (liver/gb) · 14 of 43 slices shown]
[im 1/43]
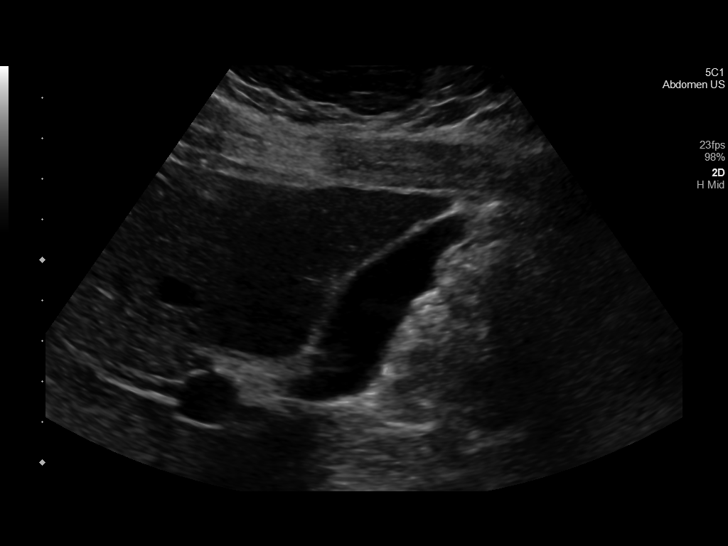
[im 4/43]
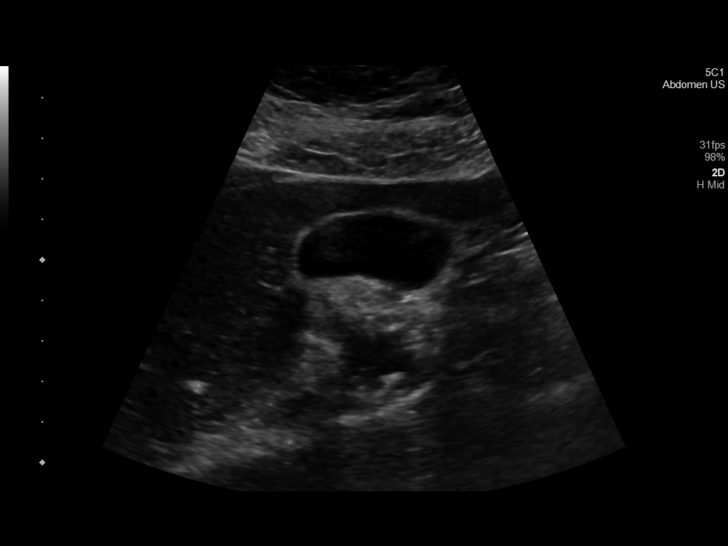
[im 8/43]
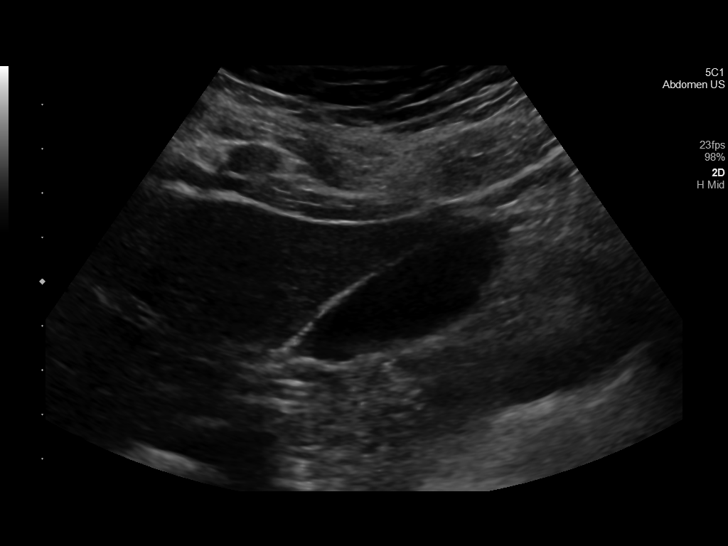
[im 11/43]
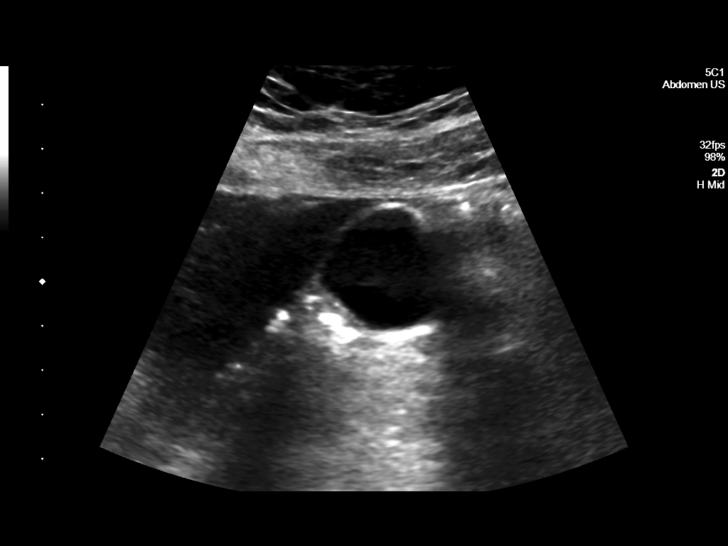
[im 15/43]
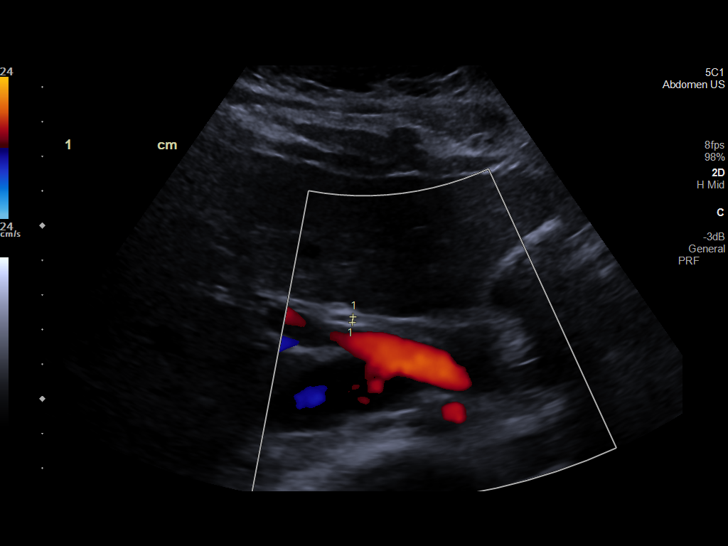
[im 16/43]
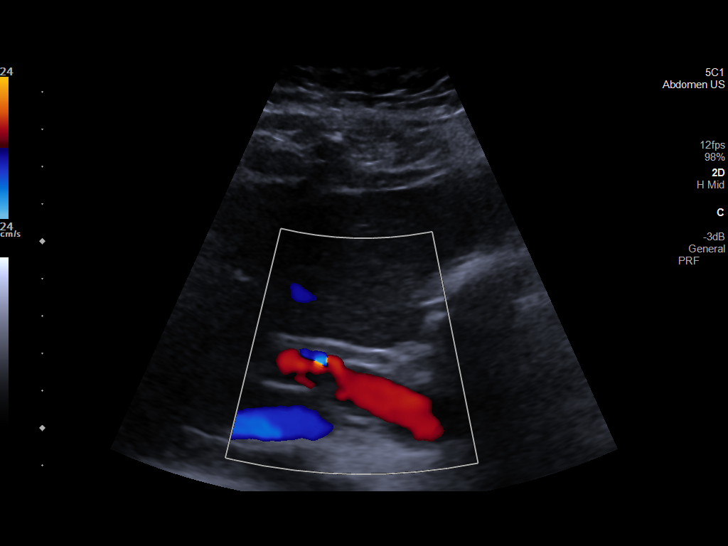
[im 20/43]
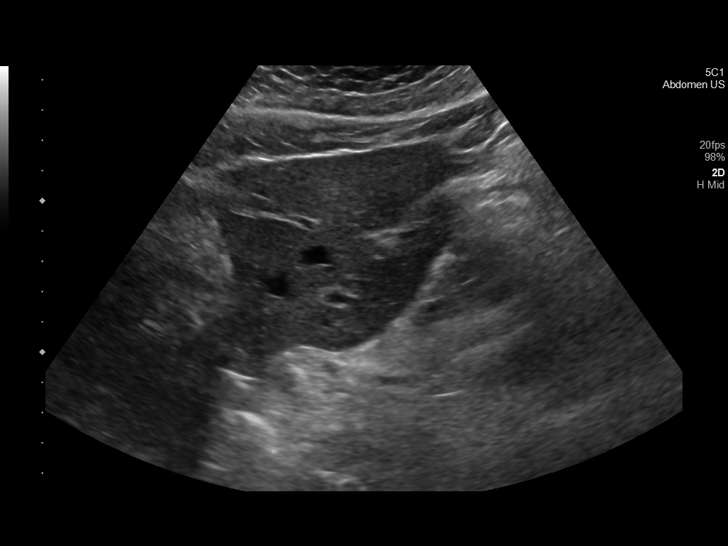
[im 23/43]
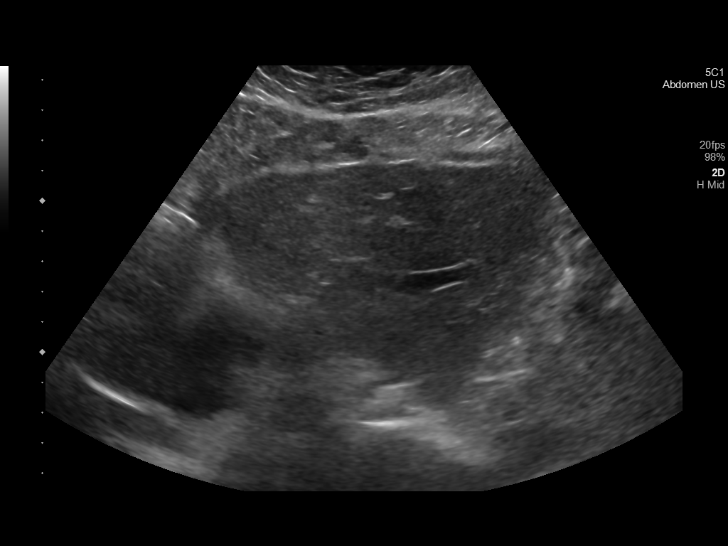
[im 27/43]
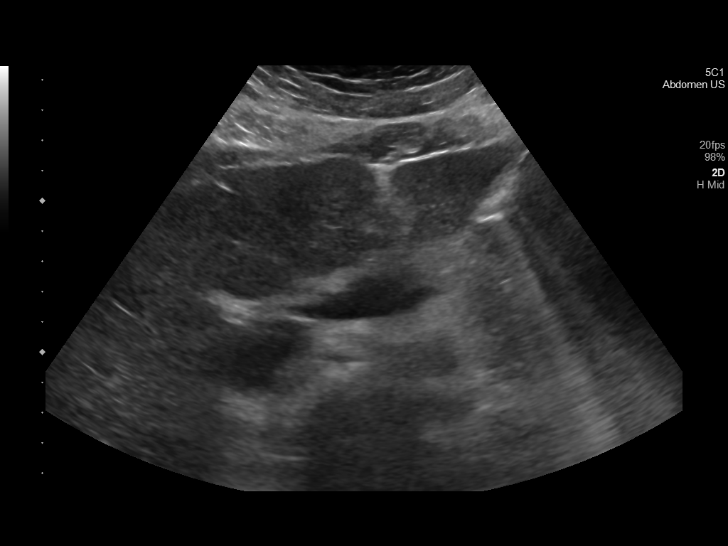
[im 29/43]
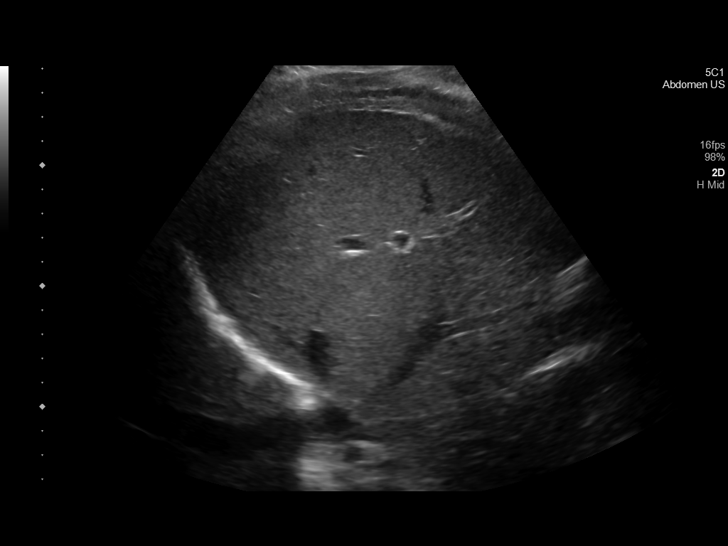
[im 32/43]
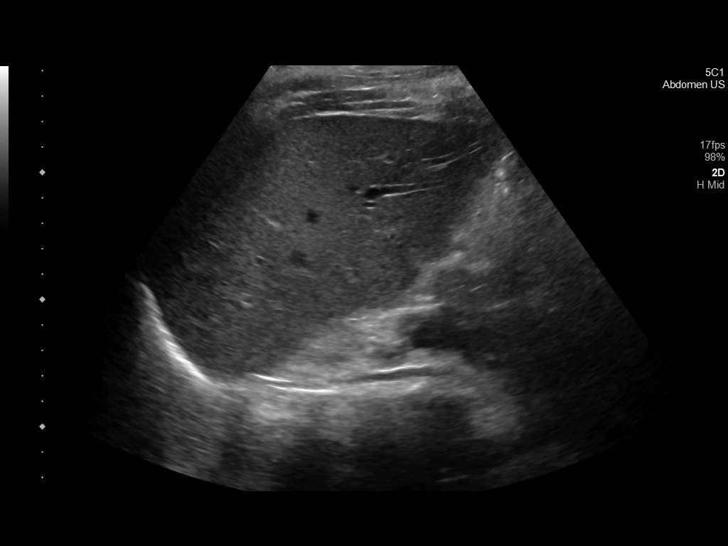
[im 36/43]
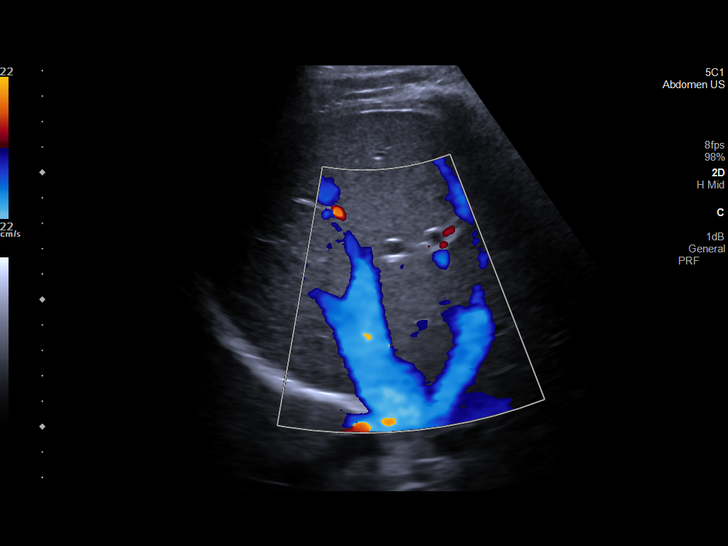
[im 39/43]
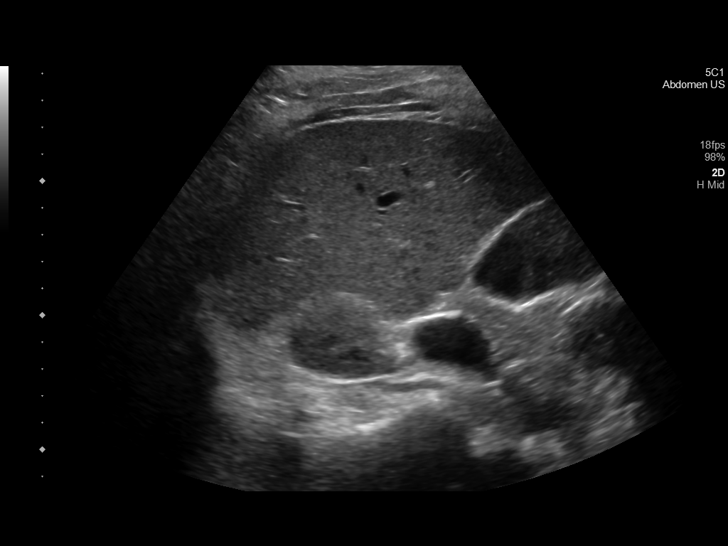
[im 43/43]
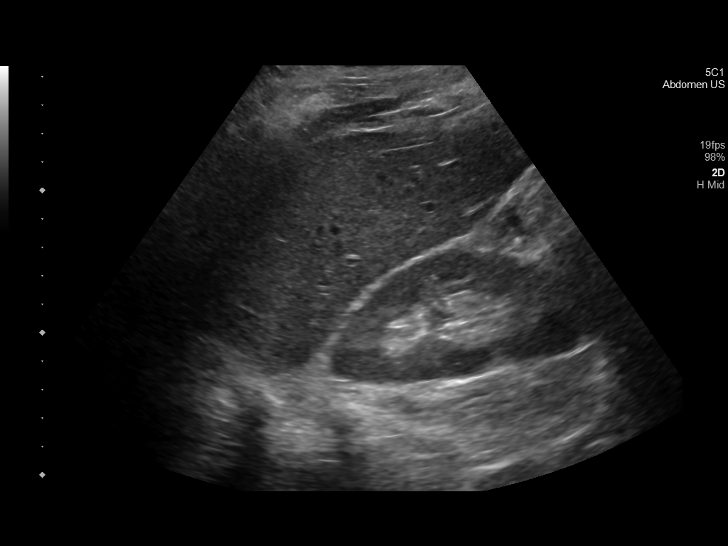

[14 of 25 positions shown; findings below may reference images not displayed]

FINDINGS: Gallbladder:

No gallstones or wall thickening visualized. No sonographic Murphy
sign noted by sonographer.

Common bile duct:

Diameter: 2 mm which is within normal limits.

Liver:

No focal lesion identified. Within normal limits in parenchymal
echogenicity. Portal vein is patent on color Doppler imaging with
normal direction of blood flow towards the liver.

Other: None.
IMPRESSION: No abnormality seen in the right upper quadrant of the abdomen.

## 2024-04-10 ENCOUNTER — Emergency Department

## 2024-04-10 ENCOUNTER — Other Ambulatory Visit: Payer: Self-pay

## 2024-04-10 ENCOUNTER — Emergency Department
Admission: EM | Admit: 2024-04-10 | Discharge: 2024-04-10 | Disposition: A | Discharge status: Home / Self Care | Attending: Emergency Medicine | Admitting: Emergency Medicine

## 2024-04-10 DIAGNOSIS — R1032 Left lower quadrant pain: Secondary | ICD-10-CM | POA: Diagnosis present

## 2024-04-10 LAB — COMPREHENSIVE METABOLIC PANEL WITH GFR
ALT: 13 U/L (ref 0–44)
AST: 18 U/L (ref 15–41)
Albumin: 3.4 g/dL — ABNORMAL LOW (ref 3.5–5.0)
Alkaline Phosphatase: 58 U/L (ref 38–126)
Anion gap: 9 (ref 5–15)
BUN: 11 mg/dL (ref 6–20)
CO2: 24 mmol/L (ref 22–32)
Calcium: 8.4 mg/dL — ABNORMAL LOW (ref 8.9–10.3)
Chloride: 105 mmol/L (ref 98–111)
Creatinine, Ser: 1.02 mg/dL — ABNORMAL HIGH (ref 0.44–1.00)
GFR, Estimated: >60 mL/min (ref >60)
Glucose, Bld: 99 mg/dL (ref 70–99)
Potassium: 3.7 mmol/L (ref 3.5–5.1)
Sodium: 138 mmol/L (ref 135–145)
Total Bilirubin: 0.5 mg/dL (ref 0.0–1.2)
Total Protein: 6.6 g/dL (ref 6.5–8.1)

## 2024-04-10 LAB — CBC
HCT: 36.7 % (ref 36.0–46.0)
Hemoglobin: 11.1 g/dL — ABNORMAL LOW (ref 12.0–15.0)
MCH: 24.1 pg — ABNORMAL LOW (ref 26.0–34.0)
MCHC: 30.2 g/dL (ref 30.0–36.0)
MCV: 79.8 fL — ABNORMAL LOW (ref 80.0–100.0)
Platelets: 310 K/uL (ref 150–400)
RBC: 4.6 MIL/uL (ref 3.87–5.11)
RDW: 16.3 % — ABNORMAL HIGH (ref 11.5–15.5)
WBC: 7.2 K/uL (ref 4.0–10.5)
nRBC: 0 % (ref 0.0–0.2)

## 2024-04-10 LAB — LIPASE, BLOOD: Lipase: 33 U/L (ref 11–51)

## 2024-04-10 LAB — URINALYSIS, ROUTINE W REFLEX MICROSCOPIC
Bilirubin Urine: NEGATIVE
Glucose, UA: NEGATIVE mg/dL
Hgb urine dipstick: NEGATIVE
Ketones, ur: NEGATIVE mg/dL
Nitrite: NEGATIVE
Protein, ur: NEGATIVE mg/dL
Specific Gravity, Urine: 1.025 (ref 1.005–1.030)
pH: 6 (ref 5.0–8.0)

## 2024-04-10 LAB — SEDIMENTATION RATE: Sed Rate: 19 mm/h (ref 0–20)

## 2024-04-10 LAB — C-REACTIVE PROTEIN: CRP: 1.4 mg/dL — ABNORMAL HIGH (ref <1.0)

## 2024-04-10 LAB — POC URINE PREG, ED: Preg Test, Ur: NEGATIVE

## 2024-04-10 MED ORDER — OXYCODONE HCL 5 MG PO TABS: 5.0000 mg | ORAL_TABLET | Freq: Three times a day (TID) | ORAL | 0 refills | Status: AC | PRN

## 2024-04-10 MED ORDER — MORPHINE SULFATE (PF) 4 MG/ML IV SOLN
4.0000 mg | Freq: Once | INTRAVENOUS | Status: AC
  Administered 2024-04-10: 4 mg via INTRAVENOUS
  Filled 2024-04-10: qty 1

## 2024-04-10 MED ORDER — OXYCODONE HCL 5 MG PO TABS: 5.0000 mg | ORAL_TABLET | Freq: Three times a day (TID) | ORAL | 0 refills | Status: DC | PRN

## 2024-04-10 MED ORDER — KETOROLAC TROMETHAMINE 15 MG/ML IJ SOLN
15.0000 mg | Freq: Once | INTRAMUSCULAR | Status: AC
  Administered 2024-04-10: 15 mg via INTRAVENOUS
  Filled 2024-04-10: qty 1

## 2024-04-10 MED ORDER — IOHEXOL 300 MG/ML  SOLN
100.0000 mL | Freq: Once | INTRAMUSCULAR | Status: AC | PRN
  Administered 2024-04-10: 100 mL via INTRAVENOUS

## 2024-04-10 MED ORDER — OXYCODONE HCL 5 MG PO TABS
5.0000 mg | ORAL_TABLET | Freq: Once | ORAL | Status: AC
  Administered 2024-04-10: 5 mg via ORAL
  Filled 2024-04-10: qty 1

## 2024-04-10 NOTE — ED Triage Notes (Signed)
 Pt to ED via POV from home. Pt reports left sided groin pain x2 wks. Pt reports no relief with OTC pain medication. Denies N/V/D or urinary symptoms.

## 2024-04-10 NOTE — ED Provider Notes (Signed)
 [facility] Provider Note    Event Date/Time   First MD Initiated Contact with Patient 04/10/24 [phone]     (approximate)   History   Groin Pain   HPI  Carolyn Nunez is a 38 y.o. female who presents today for evaluation of left groin pain.  Patient reports that her pain is worsened by external rotation of her hip.  She reports that this has been ongoing for the past 2 weeks but has gotten worse.  She denies any urinary symptoms.  She has not had any vaginal discharge or bleeding.  She has not noticed any swelling to her perineal or vaginal area.  No fevers or chills.  She is able to ambulate but has pain with ambulation.  Patient Active Problem List   Diagnosis Date Noted   Obesity 05/11/2021   Flexor tenosynovitis of finger    Cellulitis 05/10/2021   Abdominal pain 09/17/2015          Physical Exam   Triage Vital Signs: ED Triage Vitals  Encounter Vitals Group     BP 04/10/24 0851 (!) 136/94     Girls Systolic BP Percentile --      Girls Diastolic BP Percentile --      Boys Systolic BP Percentile --      Boys Diastolic BP Percentile --      Pulse Rate 04/10/24 0851 79     Resp 04/10/24 0851 18     Temp 04/10/24 0907 97.6 F (36.4 C)     Temp Source 04/10/24 0907 Oral     SpO2 04/10/24 0851 98 %     Weight 04/10/24 0857 201 lb 1 oz (91.2 kg)     Height 04/10/24 0857 5' 3 (1.6 m)     Head Circumference --      Peak Flow --      Pain Score 04/10/24 0850 10     Pain Loc --      Pain Education --      Exclude from Growth Chart --     Most recent vital signs: Vitals:   04/10/24 1406 04/10/24 1407  BP:  130/88  Pulse:  70  Resp:  18  Temp: 97.9 F (36.6 C) 97.9 F (36.6 C)  SpO2:  98%    Physical Exam Vitals and nursing note reviewed.  Constitutional:      General: Awake and alert. No acute distress.    Appearance: Normal appearance.  HENT:     Head: Normocephalic and atraumatic.     Mouth: Mucous membranes are moist.   Eyes:     General: PERRL. Normal EOMs        Right eye: No discharge.        Left eye: No discharge.     Conjunctiva/sclera: Conjunctivae normal.  Cardiovascular:     Rate and Rhythm: Normal rate and regular rhythm.     Pulses: Normal pulses.  Pulmonary:     Effort: Pulmonary effort is normal. No respiratory distress.     Breath sounds: Normal breath sounds.  Abdominal:     Abdomen is soft. There is no abdominal tenderness. No rebound or guarding. No distention.  Tenderness palpation to left groin area.  No ecchymosis, swelling, or erythema noted.  Full active and passive range of motion of the left hip, though pain with hip external rotation.  Normal external genitalia. 2+ distal pulses, foot is warm and well-perfused and equal to opposite.  No pitting edema throughout  the lower extremity. Musculoskeletal:        General: No swelling. Normal range of motion.     Cervical back: Normal range of motion and neck supple.  Skin:    General: Skin is warm and dry.     Capillary Refill: Capillary refill takes less than 2 seconds.     Findings: No rash.  Neurological:     Mental Status: The patient is awake and alert.      ED Results / Procedures / Treatments   Labs (all labs ordered are listed, but only abnormal results are displayed) Labs Reviewed  COMPREHENSIVE METABOLIC PANEL WITH GFR - Abnormal; Notable for the following components:      Result Value   Creatinine, Ser 1.02 (*)    Calcium 8.4 (*)    Albumin 3.4 (*)    All other components within normal limits  CBC - Abnormal; Notable for the following components:   Hemoglobin 11.1 (*)    MCV 79.8 (*)    MCH 24.1 (*)    RDW 16.3 (*)    All other components within normal limits  URINALYSIS, ROUTINE W REFLEX MICROSCOPIC - Abnormal; Notable for the following components:   Color, Urine YELLOW (*)    APPearance HAZY (*)    Leukocytes,Ua TRACE (*)    Bacteria, UA RARE (*)    All other components within normal limits  C-REACTIVE  PROTEIN - Abnormal; Notable for the following components:   CRP 1.4 (*)    All other components within normal limits  LIPASE, BLOOD  SEDIMENTATION RATE  POC URINE PREG, ED     EKG     RADIOLOGY I independently reviewed and interpreted imaging and agree with radiologists findings.     PROCEDURES:  Critical Care performed:   Procedures   MEDICATIONS ORDERED IN ED: Medications  ketorolac  (TORADOL ) 15 MG/ML injection 15 mg (15 mg Intravenous Given 04/10/24 0932)  iohexol  (OMNIPAQUE ) 300 MG/ML solution 100 mL (100 mLs Intravenous Contrast Given 04/10/24 1048)  morphine  (PF) 4 MG/ML injection 4 mg (4 mg Intravenous Given 04/10/24 1204)  oxyCODONE  (Oxy IR/ROXICODONE ) immediate release tablet 5 mg (5 mg Oral Given 04/10/24 1428)     IMPRESSION / MDM / ASSESSMENT AND PLAN / ED COURSE  I reviewed the triage vital signs and the nursing notes.   Differential diagnosis includes, but is not limited to, hip pathology/bursitis or labral tear, diverticulitis, ovarian etiology, lymphadenopathy, hernia.  Patient is awake and alert, hemodynamically stable and afebrile.  She is tenderness palpation in her left inguinal fold, without any bulging or skin changes noted.  She has pain particularly with external rotation of her hip, raising suspicion for hip pathology.  She denies any concern for STDs, denies vaginal discharge or bleeding, unlikely lymphadenopathy secondary to STD.  Further workup is indicated.  Labs were obtained in triage.  Will also obtain imaging for evaluation of intra-abdominal or intrapelvic pathology.  X-ray obtained of her hip given possibility of hip etiology.   X-ray of her hip was negative for any acute findings. CT pelvis reveals nonspecific inflammatory stranding about the left common femoral vessels.  Patient has not had any recent catheterizations or vascular interventions.  Radiology recommended a vascular ultrasound.  Discussed with ultrasound for proper protocol  and.  The ultrasound technician discussed directly with the CT reading radiologist and the appropriate ultrasounds were ordered.   DVT ultrasound was negative.  The arterial ultrasound revealed trace edema in the perivascular soft tissues, with question of medium  vessel vasculitis.  Subsequently, ESR and CRP were ordered. Passed off to S. Fisher, PA-C pending labs/final disposition.   Patient's presentation is most consistent with acute presentation with potential threat to life or bodily function.   Clinical Course as of 04/11/24 1138  Mon Apr 10, 2024  1134 Spoke with ultrasound regarding abnormal CT read, they recommended specific ultrasound which was placed [JP]  1157 After discussion with ultrasound tech and reading radiologist, we will obtain limited arterial study and DVT study [JP]    Clinical Course User Index [JP] Marguerette Sheller E, PA-C     FINAL CLINICAL IMPRESSION(S) / ED DIAGNOSES   Final diagnoses:  Groin pain, left     Rx / DC Orders   ED Discharge Orders          Ordered    oxyCODONE  (ROXICODONE ) 5 MG immediate release tablet  Every 8 hours PRN,   Status:  Discontinued        04/10/24 1504    oxyCODONE  (ROXICODONE ) 5 MG immediate release tablet  Every 8 hours PRN,   Status:  Discontinued        04/10/24 1607    Ambulatory Referral to Primary Care (Establish Care)       Comments: No pcp   04/10/24 1608    oxyCODONE  (ROXICODONE ) 5 MG immediate release tablet  Every 8 hours PRN        04/10/24 1625             Note:  This document was prepared using Dragon voice recognition software and may include unintentional dictation errors.   Jsaon Yoo E, PA-C 04/11/24 1138    Suzanne Kirsch, MD 04/11/24 [phone]

## 2024-04-10 NOTE — ED Notes (Signed)
 See triage note  Presents with a 2 week period of left groin/hip pain  States she has had some min relief with IBU Denies any fever or n/v/d States pain has progressed  Ambulates well with slight limp d/t pain

## 2024-04-10 NOTE — ED Provider Notes (Signed)
  Physical Exam  BP 130/88   Pulse 70   Temp 97.9 F (36.6 C) (Oral)   Resp 18   Ht 5' 3 (1.6 m)   Wt 91.2 kg   LMP 04/02/2024 (Approximate)   SpO2 98%   BMI 35.62 kg/m   Physical Exam  Procedures  Procedures  ED Course / MDM   Clinical Course as of 04/10/24 1512  Mon Apr 10, 2024  1134 Spoke with ultrasound regarding abnormal CT read, they recommended specific ultrasound which was placed [JP]  1157 After discussion with ultrasound tech and reading radiologist, we will obtain limited arterial study and DVT study [JP]    Clinical Course User Index [JP] Poggi, Jenna E, PA-C   Medical Decision Making Amount and/or Complexity of Data Reviewed Labs: ordered. Radiology: ordered.  Risk Prescription drug management.   Assuming care from outgoing provider.  Plan at this time is to evaluate ESR, CRP.  Concerns for vasculitis at the femoral artery.  Discharged to home if normal   sed rate reassuring  Explainded findings to the patient.  She is to follow-up with her regular doctor.  States she does not have a PCP so I did give her ambulatory referral for PCP and a list of PCP options.  She can also follow-up with orthopedics as there is no vascular injury on her exam.  Outgoing provider had left a prescription for oxycodone .  I did have to reprint this as she did not sign it.  Prescription was signed by me and sent with patient to the pharmacy she is in agreement treatment plan discharged stable condition.     Gasper Devere ORN, PA-C 04/10/24 1612    Floy Roberts, MD 04/10/24 [phone]

## 2024-04-10 NOTE — Discharge Instructions (Signed)
 Follow-up with orthopedics if not improving 1 week.  Return emergency department for worsening.  Call one of the following physicians to establish primary care provider.  I also put in a referral for you  Please go to the following website to schedule new (and existing) patient appointments:   [email or web address]   The following is a list of primary care offices in the area who are accepting new patients at this time.  Please reach out to one of them directly and let them know you would like to schedule an appointment to follow up on an Emergency Department visit, and/or to establish a new primary care provider (PCP).  There are likely other primary care clinics in the are who are accepting new patients, but this is an excellent place to start:  [facility] Lead physician: Dr Jon Eva [address] [place], [place] [address] [phone]  [facility] Lead Physician: Dr Dorette Loron [address], [place], [place] [address] [phone]  [facility]  Lead Physician: Dr Duwaine Louder [address] [place], [place] [address] [phone]  [facility] Lead Physician: Dr Marolyn Officer [address] [place], [place] [address] [phone]  [facility] Lead Physician: Dr Leita Adie [address] [place], [place], [place] [address] [phone]
